# Patient Record
Sex: Female | Born: 1995 | Race: Black or African American | Hispanic: No | Marital: Single | Smoking: Never smoker
Health system: Southern US, Community
[De-identification: ages and names within clinical notes are randomized; demographics above are authoritative.]

## PROBLEM LIST (undated history)

## (undated) ENCOUNTER — Inpatient Hospital Stay (HOSPITAL_COMMUNITY): Payer: Self-pay

## (undated) DIAGNOSIS — K219 Gastro-esophageal reflux disease without esophagitis: Secondary | ICD-10-CM

## (undated) DIAGNOSIS — Z8774 Personal history of (corrected) congenital malformations of heart and circulatory system: Secondary | ICD-10-CM

## (undated) DIAGNOSIS — D649 Anemia, unspecified: Secondary | ICD-10-CM

## (undated) DIAGNOSIS — N730 Acute parametritis and pelvic cellulitis: Secondary | ICD-10-CM

## (undated) HISTORY — DX: Personal history of (corrected) congenital malformations of heart and circulatory system: Z87.74

## (undated) HISTORY — PX: PATENT DUCTUS ARTERIOUS REPAIR: SHX269

## (undated) HISTORY — DX: Gastro-esophageal reflux disease without esophagitis: K21.9

---
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
## 2017-05-01 ENCOUNTER — Inpatient Hospital Stay (HOSPITAL_COMMUNITY)
Admission: AD | Admit: 2017-05-01 | Discharge: 2017-05-01 | Disposition: A | Discharge status: Home / Self Care | Payer: No Typology Code available for payment source | Source: Ambulatory Visit | Attending: Obstetrics & Gynecology | Admitting: Obstetrics & Gynecology

## 2017-05-01 ENCOUNTER — Encounter (HOSPITAL_COMMUNITY): Payer: Self-pay

## 2017-05-01 DIAGNOSIS — N926 Irregular menstruation, unspecified: Secondary | ICD-10-CM | POA: Diagnosis not present

## 2017-05-01 DIAGNOSIS — Z9889 Other specified postprocedural states: Secondary | ICD-10-CM | POA: Insufficient documentation

## 2017-05-01 DIAGNOSIS — R0781 Pleurodynia: Secondary | ICD-10-CM | POA: Diagnosis present

## 2017-05-01 DIAGNOSIS — Z880 Allergy status to penicillin: Secondary | ICD-10-CM | POA: Diagnosis not present

## 2017-05-01 LAB — URINALYSIS, ROUTINE W REFLEX MICROSCOPIC
BACTERIA UA: NONE SEEN
BILIRUBIN URINE: NEGATIVE
Glucose, UA: NEGATIVE mg/dL
Ketones, ur: NEGATIVE mg/dL
LEUKOCYTES UA: NEGATIVE
NITRITE: NEGATIVE
PH: 5 (ref 5.0–8.0)
Protein, ur: NEGATIVE mg/dL
SPECIFIC GRAVITY, URINE: 1.017 (ref 1.005–1.030)

## 2017-05-01 LAB — POCT PREGNANCY, URINE: Preg Test, Ur: NEGATIVE

## 2017-05-01 MED ORDER — NAPROXEN 500 MG PO TABS: 500.0000 mg | ORAL_TABLET | Freq: Two times a day (BID) | ORAL | 0 refills | Status: DC

## 2017-05-01 NOTE — MAU Note (Signed)
Pt presents to MAU with complaints of pain in the upper part of her abdomen since Thursday. Reports her last normal cycle was April the 28th. Scant amount of VB noted earlier in the week

## 2017-05-01 NOTE — Discharge Instructions (Signed)
Costochondritis Costochondritis is swelling and irritation (inflammation) of the tissue (cartilage) that connects your ribs to your breastbone (sternum). This causes pain in the front of your chest. Usually, the pain:  Starts gradually.  Is in more than one rib. This condition usually goes away on its own over time. Follow these instructions at home:  Do not do anything that makes your pain worse.  If directed, put ice on the painful area:  Put ice in a plastic bag.  Place a towel between your skin and the bag.  Leave the ice on for 20 minutes, 2-3 times a day.  If directed, put heat on the affected area as often as told by your doctor. Use the heat source that your doctor tells you to use, such as a moist heat pack or a heating pad.  Place a towel between your skin and the heat source.  Leave the heat on for 20-30 minutes.  Take off the heat if your skin turns bright red. This is very important if you cannot feel pain, heat, or cold. You may have a greater risk of getting burned.  Take over-the-counter and prescription medicines only as told by your doctor.  Return to your normal activities as told by your doctor. Ask your doctor what activities are safe for you.  Keep all follow-up visits as told by your doctor. This is important. Contact a doctor if:  You have chills or a fever.  Your pain does not go away or it gets worse.  You have a cough that does not go away. Get help right away if:  You are short of breath. This information is not intended to replace advice given to you by your health care provider. Make sure you discuss any questions you have with your health care provider. Document Released: 05/10/2008 Document Revised: 06/11/2016 Document Reviewed: 03/17/2016 Elsevier Interactive Patient Education  2017 [employer]  

## 2017-05-01 NOTE — MAU Provider Note (Signed)
None     Chief Complaint:  Possible Pregnancy and Abdominal Pain   Lori Moses is  21 y.o. G0P0000.  Patient's last menstrual period was 03/29/2017.Marland Kitchen  Her pregnancy status is negative.  She actually started her period around 0200, but was concerned because it was 2 days late.  Also, she has had some intermittent bilateral rib pain for 4 days.  Doesn't hurt when breathing deep, moving, or really asso w/anything in particular.  Just has random moments of it hurting on her right and left sides where her ribs are.    Past Medical History:  Diagnosis Date  . Medical history non-contributory     Past Surgical History:  Procedure Laterality Date  . HEART TUMOR EXCISION     as an infant    No family history on file.  Social History  Substance Use Topics  . Smoking status: Never Smoker  . Smokeless tobacco: Never Used  . Alcohol use No    Allergies:  Allergies  Allergen Reactions  . Other Hives    potatoes  . Penicillins Hives    Has patient had a PCN reaction causing immediate rash, facial/tongue/throat swelling, SOB or lightheadedness with hypotension: Yes Has patient had a PCN reaction causing severe rash involving mucus membranes or skin necrosis: No Has patient had a PCN reaction that required hospitalization Yes Has patient had a PCN reaction occurring within the last 10 years: No If all of the above answers are "NO", then may proceed with Cephalosporin use.     Prescriptions Prior to Admission  Medication Sig Dispense Refill Last Dose  . cetirizine (ZYRTEC) 10 MG tablet Take 10 mg by mouth daily.   Past Week at Unknown time  . IRON PO Take 1 tablet by mouth daily.   Past Week at Unknown time  . clindamycin (CLEOCIN) 300 MG capsule Take 1 capsule (300 mg total) by mouth 2 (two) times daily. (Patient not taking: Reported on 05/01/2017) 14 capsule 0 Not Taking at Unknown time     Review of Systems   Constitutional: Negative for fever and chills Eyes: Negative for  visual disturbances Respiratory: Negative for shortness of breath, dyspnea Cardiovascular: Negative for chest pain or palpitations  Gastrointestinal: Negative for vomiting, diarrhea and constipation Genitourinary: Negative for dysuria and urgency Musculoskeletal: Negative for back pain Neurological: Negative for dizziness and headaches     Physical Exam   Blood pressure 138/60, pulse 88, temperature 98 F (36.7 C), resp. rate 18, height 5\' 4"  (1.626 m), weight 64.4 kg (142 lb), last menstrual period 03/29/2017, SpO2 99 %.  General: General appearance - alert, well appearing, and in no distress and oriented to person, place, and time Chest - normal respiratory effort Heart - normal rate and regular rhythm Abdomen - soft, non tender.  Ribs not tender to palpation.  No pain at all during exam Pelvic - menstrual blood.  Cx non fribale.  Extremities - no pedal edema noted   Labs: Results for orders placed or performed during the hospital encounter of 05/01/17 (from the past 24 hour(s))  Urinalysis, Routine w reflex microscopic   Collection Time: 05/01/17  1:05 PM  Result Value Ref Range   Color, Urine YELLOW YELLOW   APPearance CLEAR CLEAR   Specific Gravity, Urine 1.017 1.005 - 1.030   pH 5.0 5.0 - 8.0   Glucose, UA NEGATIVE NEGATIVE mg/dL   Hgb urine dipstick LARGE (A) NEGATIVE   Bilirubin Urine NEGATIVE NEGATIVE   Ketones, ur NEGATIVE NEGATIVE  mg/dL   Protein, ur NEGATIVE NEGATIVE mg/dL   Nitrite NEGATIVE NEGATIVE   Leukocytes, UA NEGATIVE NEGATIVE   RBC / HPF TOO NUMEROUS TO COUNT 0 - 5 RBC/hpf   WBC, UA 0-5 0 - 5 WBC/hpf   Bacteria, UA NONE SEEN NONE SEEN   Squamous Epithelial / LPF 0-5 (A) NONE SEEN   Mucous PRESENT   Pregnancy, urine POC   Collection Time: 05/01/17  1:13 PM  Result Value Ref Range   Preg Test, Ur NEGATIVE NEGATIVE   Imaging Studies:  No results found.   Assessment: Period "late" by 2 days, started this morning.   Plan: Declines BC.   Naproxyn for rib pain.  F/U if rib pain is persistent or becomes more severe or asso w/other sx.   CRESENZO-DISHMAN,Aryanah Enslow

## 2017-05-03 LAB — GC/CHLAMYDIA PROBE AMP (~~LOC~~) NOT AT ARMC
CHLAMYDIA, DNA PROBE: NEGATIVE
Neisseria Gonorrhea: NEGATIVE

## 2017-05-06 DIAGNOSIS — N730 Acute parametritis and pelvic cellulitis: Secondary | ICD-10-CM

## 2017-05-06 HISTORY — DX: Acute parametritis and pelvic cellulitis: N73.0

## 2017-05-17 ENCOUNTER — Encounter (HOSPITAL_COMMUNITY): Payer: Self-pay | Admitting: *Deleted

## 2017-05-17 ENCOUNTER — Inpatient Hospital Stay (HOSPITAL_COMMUNITY): Payer: No Typology Code available for payment source

## 2017-05-17 ENCOUNTER — Inpatient Hospital Stay (HOSPITAL_COMMUNITY)
Admission: AD | Admit: 2017-05-17 | Discharge: 2017-05-17 | Disposition: A | Discharge status: Home / Self Care | Payer: No Typology Code available for payment source | Source: Ambulatory Visit | Attending: Obstetrics & Gynecology | Admitting: Obstetrics & Gynecology

## 2017-05-17 DIAGNOSIS — N730 Acute parametritis and pelvic cellulitis: Secondary | ICD-10-CM

## 2017-05-17 DIAGNOSIS — N83291 Other ovarian cyst, right side: Secondary | ICD-10-CM | POA: Diagnosis not present

## 2017-05-17 DIAGNOSIS — Z880 Allergy status to penicillin: Secondary | ICD-10-CM | POA: Diagnosis not present

## 2017-05-17 DIAGNOSIS — N739 Female pelvic inflammatory disease, unspecified: Secondary | ICD-10-CM | POA: Insufficient documentation

## 2017-05-17 DIAGNOSIS — M549 Dorsalgia, unspecified: Secondary | ICD-10-CM | POA: Diagnosis present

## 2017-05-17 DIAGNOSIS — R102 Pelvic and perineal pain: Secondary | ICD-10-CM | POA: Insufficient documentation

## 2017-05-17 LAB — URINALYSIS, ROUTINE W REFLEX MICROSCOPIC
BILIRUBIN URINE: NEGATIVE
Glucose, UA: NEGATIVE mg/dL
Ketones, ur: NEGATIVE mg/dL
NITRITE: NEGATIVE
PH: 6 (ref 5.0–8.0)
Protein, ur: NEGATIVE mg/dL
SPECIFIC GRAVITY, URINE: 1.025 (ref 1.005–1.030)

## 2017-05-17 LAB — CBC
HCT: 37.2 % (ref 36.0–46.0)
Hemoglobin: 12.5 g/dL (ref 12.0–15.0)
MCH: 27.7 pg (ref 26.0–34.0)
MCHC: 33.6 g/dL (ref 30.0–36.0)
MCV: 82.3 fL (ref 78.0–100.0)
PLATELETS: 186 10*3/uL (ref 150–400)
RBC: 4.52 MIL/uL (ref 3.87–5.11)
RDW: 13.8 % (ref 11.5–15.5)
WBC: 5.5 10*3/uL (ref 4.0–10.5)

## 2017-05-17 LAB — WET PREP, GENITAL
CLUE CELLS WET PREP: NONE SEEN
SPERM: NONE SEEN
Trich, Wet Prep: NONE SEEN
Yeast Wet Prep HPF POC: NONE SEEN

## 2017-05-17 LAB — POCT PREGNANCY, URINE: PREG TEST UR: NEGATIVE

## 2017-05-17 MED ORDER — DOXYCYCLINE HYCLATE 100 MG PO CAPS: 100.0000 mg | ORAL_CAPSULE | Freq: Two times a day (BID) | ORAL | 0 refills | Status: DC

## 2017-05-17 MED ORDER — CEFTRIAXONE SODIUM 250 MG IJ SOLR
250.0000 mg | INTRAMUSCULAR | Status: DC
  Administered 2017-05-17: 250 mg via INTRAMUSCULAR
  Filled 2017-05-17: qty 250

## 2017-05-17 MED ORDER — AZITHROMYCIN 250 MG PO TABS
1000.0000 mg | ORAL_TABLET | Freq: Once | ORAL | Status: AC
  Administered 2017-05-17: 1000 mg via ORAL
  Filled 2017-05-17: qty 4

## 2017-05-17 NOTE — MAU Note (Addendum)
At 2105 Ceftriaxone 250mg /ml, given IM, right dorsal gluteal muscle, mixed with Lidocaine, pt. Tolerated procedure with small amount of discomfort.   No reaction noted, pt. Currently texting on cell. phone, per pt. No itching, no SOB, no swelling, no signs of an anaphylactic reaction. RN will continue to observe.

## 2017-05-17 NOTE — Discharge Instructions (Signed)
Pelvic Inflammatory Disease °Pelvic inflammatory disease (PID) refers to an infection in some or all of the female organs. The infection can be in the uterus, ovaries, fallopian tubes, or the surrounding tissues in the pelvis. PID can cause abdominal or pelvic pain that comes on suddenly (acute pelvic pain). PID is a serious infection because it can lead to lasting (chronic) pelvic pain or the inability to have children (infertility). °What are the causes? °This condition is most often caused by an infection that is spread during sexual contact. However, the infection can also be caused by the normal bacteria that are found in the vaginal tissues if these bacteria travel upward into the reproductive organs. PID can also occur following: °· The birth of a baby. °· A miscarriage. °· An abortion. °· Major pelvic surgery. °· The use of an intrauterine device (IUD). °· A sexual assault. ° °What increases the risk? °This condition is more likely to develop in women who: °· Are younger than 19 years of age. °· Are sexually active at a [place] age. °· Use nonbarrier contraception. °· Have multiple sexual partners. °· Have sex with someone who has symptoms of an STD (sexually transmitted disease). °· Use oral contraception. ° °At times, certain behaviors can also increase the possibility of getting PID, such as: °· Using a vaginal douche. °· Having an IUD in place. ° °What are the signs or symptoms? °Symptoms of this condition include: °· Abdominal or pelvic pain. °· Fever. °· Chills. °· Abnormal vaginal discharge. °· Abnormal uterine bleeding. °· Unusual pain shortly after the end of a menstrual period. °· Painful urination. °· Pain with sexual intercourse. °· Nausea and vomiting. ° °How is this diagnosed? °To diagnose this condition, your health care provider will do a physical exam and take your medical history. A pelvic exam typically reveals great tenderness in the uterus and the surrounding pelvic tissues. You may also  have tests, such as: °· Lab tests, including a pregnancy test, blood tests, and urine test. °· Culture tests of the vagina and cervix to check for an STD. °· Ultrasound. °· A laparoscopic procedure to look inside the pelvis. °· Examining vaginal secretions under a microscope. ° °How is this treated? °Treatment for this condition may involve one or more approaches. °· Antibiotic medicines may be prescribed to be taken by mouth. °· Sexual partners may need to be treated if the infection is caused by an STD. °· For more severe cases, hospitalization may be needed to give antibiotics directly into a vein through an IV tube. °· Surgery may be needed if other treatments do not help, but this is rare. ° °It may take weeks until you are completely well. If you are diagnosed with PID, you should also be checked for human immunodeficiency virus (HIV). Your health care provider may test you for infection again 3 months after treatment. You should not have unprotected sex. °Follow these instructions at home: °· Take over-the-counter and prescription medicines only as told by your health care provider. °· If you were prescribed an antibiotic medicine, take it as told by your health care provider. Do not stop taking the antibiotic even if you start to feel better. °· Do not have sexual intercourse until treatment is completed or as told by your health care provider. If PID is confirmed, your recent sexual partners will need treatment, especially if you had unprotected sex. °· Keep all follow-up visits as told by your health care provider. This is important. °Contact a health care   provider if: °· You have increased or abnormal vaginal discharge. °· Your pain does not improve. °· You vomit. °· You have a fever. °· You cannot tolerate your medicines. °· Your partner has an STD. °· You have pain when you urinate. °Get help right away if: °· You have increased abdominal or pelvic pain. °· You have chills. °· Your symptoms are not  better in 72 hours even with treatment. °This information is not intended to replace advice given to you by your health care provider. Make sure you discuss any questions you have with your health care provider. °Document Released: 11/22/2005 Document Revised: 04/29/2016 Document Reviewed: 12/30/2014 °Elsevier Interactive Patient Education © 2018 [employer] ° °

## 2017-05-17 NOTE — MAU Note (Signed)
Pt. Assessment of Ceftriaxone medication given at 9:03; no reaction noted.  Pt. denies hives, difficulty breathing, or itching.  Stable.

## 2017-05-17 NOTE — MAU Provider Note (Signed)
History     CSN: [id number]  Arrival date and time: 05/17/17 1605   First Provider Initiated Contact with Patient 05/17/17 1717     HPI Lori Moses is a 21 y.o. G0P0000 non pregnant female who presents complaining of left side pain that goes all the way down her leg. She states it started a week ago and she cannot lie on her side. She states the pain is constant and she rates it a 8/10 and has not tried anything for the pain. She also reports lower abdominal and back pain. She is sexually active and sometimes uses condoms. She reports a white discharge and pain with wiping her vagina.   OB History    Gravida Para Term Preterm AB Living   0 0 0 0 0 0   SAB TAB Ectopic Multiple Live Births   0 0 0 0        Past Medical History:  Diagnosis Date  . Medical history non-contributory     Past Surgical History:  Procedure Laterality Date  . HEART TUMOR EXCISION     as an infant    History reviewed. No pertinent family history.  Social History  Substance Use Topics  . Smoking status: Never Smoker  . Smokeless tobacco: Never Used  . Alcohol use No    Allergies:  Allergies  Allergen Reactions  . Other Hives    potatoes  . Penicillins Hives    Has patient had a PCN reaction causing immediate rash, facial/tongue/throat swelling, SOB or lightheadedness with hypotension: Yes Has patient had a PCN reaction causing severe rash involving mucus membranes or skin necrosis: No Has patient had a PCN reaction that required hospitalization No Has patient had a PCN reaction occurring within the last 10 years: No If all of the above answers are "NO", then may proceed with Cephalosporin use.     Prescriptions Prior to Admission  Medication Sig Dispense Refill Last Dose  . cetirizine (ZYRTEC) 10 MG tablet Take 10 mg by mouth daily.   Past Week at Unknown time  . IRON PO Take 1 tablet by mouth daily.   Past Week at Unknown time  . naproxen (NAPROSYN) 500 MG tablet Take 1 tablet (500  mg total) by mouth 2 (two) times daily with a meal. 30 tablet 0     Review of Systems  Constitutional: Negative.  Negative for activity change, appetite change, fatigue and fever.  Respiratory: Negative.   Cardiovascular: Negative.  Negative for chest pain.  Gastrointestinal: Positive for abdominal pain. Negative for constipation, diarrhea, nausea and vomiting.  Genitourinary: Positive for vaginal discharge. Negative for dysuria and vaginal bleeding.  Neurological: Negative.  Negative for dizziness and headaches.   Physical Exam   Blood pressure 116/65, pulse 68, temperature 98.5 F (36.9 C), temperature source Oral, resp. rate 17, height 5\' 6"  (1.676 m), weight 141 lb 1.9 oz (64 kg), SpO2 99 %.  Physical Exam  Nursing note and vitals reviewed. Constitutional: She is oriented to person, place, and time. She appears well-developed and well-nourished.  HENT:  Head: Normocephalic and atraumatic.  Eyes: Conjunctivae are normal. No scleral icterus.  Cardiovascular: Normal rate, regular rhythm and normal heart sounds.   Respiratory: Effort normal and breath sounds normal. No respiratory distress.  GI: Soft. She exhibits no distension. There is no tenderness. There is no guarding.  Genitourinary: Uterus normal. Cervix exhibits motion tenderness, discharge and friability. There is tenderness in the vagina. Vaginal discharge found.  Genitourinary Comments: Copious amount  of mucopurulent discharge noted at cervical os  Neurological: She is alert and oriented to person, place, and time.  Skin: Skin is warm and dry.  Psychiatric: She has a normal mood and affect. Her behavior is normal. Judgment and thought content normal.   MAU Course  Procedures Results for orders placed or performed during the hospital encounter of 05/17/17 (from the past 24 hour(s))  Urinalysis, Routine w reflex microscopic     Status: Abnormal   Collection Time: 05/17/17  4:27 PM  Result Value Ref Range   Color, Urine  YELLOW YELLOW   APPearance CLEAR CLEAR   Specific Gravity, Urine 1.025 1.005 - 1.030   pH 6.0 5.0 - 8.0   Glucose, UA NEGATIVE NEGATIVE mg/dL   Hgb urine dipstick SMALL (A) NEGATIVE   Bilirubin Urine NEGATIVE NEGATIVE   Ketones, ur NEGATIVE NEGATIVE mg/dL   Protein, ur NEGATIVE NEGATIVE mg/dL   Nitrite NEGATIVE NEGATIVE   Leukocytes, UA LARGE (A) NEGATIVE   RBC / HPF 0-5 0 - 5 RBC/hpf   WBC, UA 6-30 0 - 5 WBC/hpf   Bacteria, UA RARE (A) NONE SEEN   Squamous Epithelial / LPF 0-5 (A) NONE SEEN   Mucous PRESENT   Pregnancy, urine POC     Status: None   Collection Time: 05/17/17  4:45 PM  Result Value Ref Range   Preg Test, Ur NEGATIVE NEGATIVE  CBC     Status: None   Collection Time: 05/17/17  5:26 PM  Result Value Ref Range   WBC 5.5 4.0 - 10.5 K/uL   RBC 4.52 3.87 - 5.11 MIL/uL   Hemoglobin 12.5 12.0 - 15.0 g/dL   HCT 16.1 09.6 - 04.5 %   MCV 82.3 78.0 - 100.0 fL   MCH 27.7 26.0 - 34.0 pg   MCHC 33.6 30.0 - 36.0 g/dL   RDW 40.9 81.1 - 91.4 %   Platelets 186 150 - 400 K/uL  Wet prep, genital     Status: Abnormal   Collection Time: 05/17/17  5:36 PM  Result Value Ref Range   Yeast Wet Prep HPF POC NONE SEEN NONE SEEN   Trich, Wet Prep NONE SEEN NONE SEEN   Clue Cells Wet Prep HPF POC NONE SEEN NONE SEEN   WBC, Wet Prep HPF POC MANY (A) NONE SEEN   Sperm NONE SEEN    US Transvaginal Non-ob  Result Date: 05/17/2017 CLINICAL DATA:  None lower quadrant and back pain with cramping x2 weeks. EXAM: TRANSABDOMINAL AND TRANSVAGINAL ULTRASOUND OF PELVIS TECHNIQUE: Both transabdominal and transvaginal ultrasound examinations of the pelvis were performed. Transabdominal technique was performed for global imaging of the pelvis including uterus, ovaries, adnexal regions, and pelvic cul-de-sac. It was necessary to proceed with endovaginal exam following the transabdominal exam to visualize the uterus, endometrium and both ovaries. COMPARISON:  None FINDINGS: Uterus Measurements: 8.3 x 4 x  4.6 cm. No fibroids or other mass visualized. Endometrium Thickness: 8.8 mm.  No focal abnormality visualized. Right ovary Measurements: 3.7 x 2.6 x 3.8 cm. There appears to be an involuting cyst or follicle associated with the right ovary measuring 2.2 x 1.7 x 1.8 cm with internal debris. No mural nodularity or calcifications. Left ovary Measurements: 3 x 2.2 x 1.8 cm. Normal appearance/no adnexal mass. Other findings No abnormal free fluid. IMPRESSION: Involuting follicle or cyst in the right ovary measuring 2.2 x 1.7 x 1.8 cm with slightly crenulated margins and internal debris noted. Otherwise negative exam. Electronically Signed   By:  Tollie Eth M.D.   On: 05/17/2017 20:01   US Pelvis Complete  Result Date: 05/17/2017 CLINICAL DATA:  None lower quadrant and back pain with cramping x2 weeks. EXAM: TRANSABDOMINAL AND TRANSVAGINAL ULTRASOUND OF PELVIS TECHNIQUE: Both transabdominal and transvaginal ultrasound examinations of the pelvis were performed. Transabdominal technique was performed for global imaging of the pelvis including uterus, ovaries, adnexal regions, and pelvic cul-de-sac. It was necessary to proceed with endovaginal exam following the transabdominal exam to visualize the uterus, endometrium and both ovaries. COMPARISON:  None FINDINGS: Uterus Measurements: 8.3 x 4 x 4.6 cm. No fibroids or other mass visualized. Endometrium Thickness: 8.8 mm.  No focal abnormality visualized. Right ovary Measurements: 3.7 x 2.6 x 3.8 cm. There appears to be an involuting cyst or follicle associated with the right ovary measuring 2.2 x 1.7 x 1.8 cm with internal debris. No mural nodularity or calcifications. Left ovary Measurements: 3 x 2.2 x 1.8 cm. Normal appearance/no adnexal mass. Other findings No abnormal free fluid. IMPRESSION: Involuting follicle or cyst in the right ovary measuring 2.2 x 1.7 x 1.8 cm with slightly crenulated margins and internal debris noted. Otherwise negative exam. Electronically  Signed   By: Tollie Eth M.D.   On: 05/17/2017 20:01    MDM UA, UPT, CBC, wet prep and gc/chlamydia, ultrasound Offered pain medication, patient declined.  Patient discomfort with exam, highly concerning for PID.  Azithromycin 1g and ceftriaxone 250mg  IM given in MAU, per CDC guidelines. Discussed risks and benefits with patient's hx of penicillin allergy and patient agreeable to take medication.   Assessment and Plan   1. PID (acute pelvic inflammatory disease)   2. Pelvic pain   3. Pelvic pain in female    Discharge patient home in stable condition.  Doxycycline 100mg  PO BID for 14 days.  Return precautions reviewed with patient, return to MAU as needed.  Encouraged patient to use condoms every time she has intercourse and establish care with gyn provider.   Cleone Slim SNM 05/17/2017, 8:31 PM    I confirm that I have verified the information documented in the midwife student's note and that I have also personally reperformed the physical exam and all medical decision making activities.

## 2017-05-17 NOTE — MAU Note (Addendum)
+  Lower right abdominal pain/swelling +lower back back pain Cramping +left leg pain; intermittent Pain started last week of may 5/27 Rating pain 8/10 Worse with ambulation Has not tried anything for the pain  Neg HPT LMP 05/01/17 --patient states was unusual period for her very light and only lasted 3 days Brown spotting now

## 2017-05-18 LAB — GC/CHLAMYDIA PROBE AMP (~~LOC~~) NOT AT ARMC
CHLAMYDIA, DNA PROBE: NEGATIVE
Neisseria Gonorrhea: NEGATIVE

## 2017-06-16 ENCOUNTER — Inpatient Hospital Stay (HOSPITAL_COMMUNITY)
Admission: AD | Admit: 2017-06-16 | Discharge: 2017-06-16 | Discharge status: Against Medical Advice | Payer: No Typology Code available for payment source | Source: Ambulatory Visit | Attending: Obstetrics & Gynecology | Admitting: Obstetrics & Gynecology

## 2017-06-16 NOTE — MAU Note (Signed)
Pt not in lobby x2 

## 2017-06-16 NOTE — MAU Note (Signed)
Not in lobby x3 assume pt left will discharge AMA

## 2017-06-16 NOTE — MAU Note (Signed)
Not in lobby x1  

## 2017-06-19 ENCOUNTER — Emergency Department (HOSPITAL_COMMUNITY)
Admission: EM | Admit: 2017-06-19 | Discharge: 2017-06-19 | Disposition: A | Discharge status: Home / Self Care | Payer: No Typology Code available for payment source | Attending: Emergency Medicine | Admitting: Emergency Medicine

## 2017-06-19 ENCOUNTER — Encounter (HOSPITAL_COMMUNITY): Payer: Self-pay

## 2017-06-19 DIAGNOSIS — M545 Low back pain, unspecified: Secondary | ICD-10-CM

## 2017-06-19 HISTORY — DX: Acute parametritis and pelvic cellulitis: N73.0

## 2017-06-19 LAB — CBC
HEMATOCRIT: 35.9 % — AB (ref 36.0–46.0)
HEMOGLOBIN: 11.7 g/dL — AB (ref 12.0–15.0)
MCH: 26.9 pg (ref 26.0–34.0)
MCHC: 32.6 g/dL (ref 30.0–36.0)
MCV: 82.5 fL (ref 78.0–100.0)
Platelets: 179 10*3/uL (ref 150–400)
RBC: 4.35 MIL/uL (ref 3.87–5.11)
RDW: 13.5 % (ref 11.5–15.5)
WBC: 5.3 10*3/uL (ref 4.0–10.5)

## 2017-06-19 LAB — COMPREHENSIVE METABOLIC PANEL
ALBUMIN: 3.8 g/dL (ref 3.5–5.0)
ALT: 12 U/L — ABNORMAL LOW (ref 14–54)
AST: 20 U/L (ref 15–41)
Alkaline Phosphatase: 55 U/L (ref 38–126)
Anion gap: 5 (ref 5–15)
BILIRUBIN TOTAL: 1 mg/dL (ref 0.3–1.2)
BUN: 9 mg/dL (ref 6–20)
CHLORIDE: 106 mmol/L (ref 101–111)
CO2: 27 mmol/L (ref 22–32)
Calcium: 8.9 mg/dL (ref 8.9–10.3)
Creatinine, Ser: 0.85 mg/dL (ref 0.44–1.00)
GFR calc Af Amer: >60 mL/min (ref >60)
GFR calc non Af Amer: >60 mL/min (ref >60)
GLUCOSE: 82 mg/dL (ref 65–99)
POTASSIUM: 3.8 mmol/L (ref 3.5–5.1)
SODIUM: 138 mmol/L (ref 135–145)
TOTAL PROTEIN: 6.7 g/dL (ref 6.5–8.1)

## 2017-06-19 LAB — URINALYSIS, ROUTINE W REFLEX MICROSCOPIC
BACTERIA UA: NONE SEEN
BILIRUBIN URINE: NEGATIVE
Glucose, UA: NEGATIVE mg/dL
HGB URINE DIPSTICK: NEGATIVE
Ketones, ur: NEGATIVE mg/dL
NITRITE: NEGATIVE
PROTEIN: NEGATIVE mg/dL
Specific Gravity, Urine: 1.023 (ref 1.005–1.030)
pH: 7 (ref 5.0–8.0)

## 2017-06-19 LAB — I-STAT BETA HCG BLOOD, ED (MC, WL, AP ONLY)

## 2017-06-19 LAB — LIPASE, BLOOD: LIPASE: 24 U/L (ref 11–51)

## 2017-06-19 MED ORDER — CEPHALEXIN 250 MG PO CAPS
500.0000 mg | ORAL_CAPSULE | Freq: Once | ORAL | Status: AC
  Administered 2017-06-19: 500 mg via ORAL
  Filled 2017-06-19: qty 2

## 2017-06-19 MED ORDER — IBUPROFEN 400 MG PO TABS
400.0000 mg | ORAL_TABLET | Freq: Once | ORAL | Status: AC
  Administered 2017-06-19: 400 mg via ORAL
  Filled 2017-06-19: qty 1

## 2017-06-19 MED ORDER — CEPHALEXIN 500 MG PO CAPS: 500.0000 mg | ORAL_CAPSULE | Freq: Three times a day (TID) | ORAL | 0 refills | Status: DC

## 2017-06-19 MED ORDER — ACETAMINOPHEN 325 MG PO TABS
650.0000 mg | ORAL_TABLET | Freq: Once | ORAL | Status: AC
  Administered 2017-06-19: 650 mg via ORAL
  Filled 2017-06-19: qty 2

## 2017-06-19 NOTE — Discharge Instructions (Signed)
It was our pleasure to provide your ER care today - we hope that you feel better.  Your blood work looks good/normal.   Your urine tests show a possible urine infection - take antibiotic as prescribed.  Take ibuprofen and/or acetaminophen as need for pain.   Follow up with primary care doctor in 1 week if symptoms fail to improve/resolve.  Return to ER if worse, new symptoms, high fevers, new or severe abdominal or pelvic pain, other concern.

## 2017-06-19 NOTE — ED Provider Notes (Signed)
[facility]-EMERGENCY DEPT Provider Note   CSN: [id number] Arrival date & time: 06/19/17  1719     History   Chief Complaint Chief Complaint  Patient presents with  . Back Pain    HPI Lori Moses is a 21 y.o. female.  Patient c/o bilateral low back pain in the past couple weeks. Pain constant, dull, moderate. No specific exacerbating or alleviating factors. No radicular pain. No numbness/weakness. Denies injury, fall or strain. No anterior pain, no abd or pelvic pain. lnmp 3 weeks ago. No unusual vaginal discharge or bleeding. No fever or chills. No dysuria.    The history is provided by the patient.  Abdominal Pain   Pertinent negatives include fever, diarrhea, vomiting, constipation, dysuria and headaches.    Past Medical History:  Diagnosis Date  . Medical history non-contributory   . PID (acute pelvic inflammatory disease) 05/2017    There are no active problems to display for this patient.   Past Surgical History:  Procedure Laterality Date  . HEART TUMOR EXCISION     as an infant    OB History    Gravida Para Term Preterm AB Living   0 0 0 0 0 0   SAB TAB Ectopic Multiple Live Births   0 0 0 0         Home Medications    Prior to Admission medications   Medication Sig Start Date End Date Taking? Authorizing Provider  cetirizine (ZYRTEC) 10 MG tablet Take 10 mg by mouth daily.    [provider]  doxycycline (VIBRAMYCIN) 100 MG capsule Take 1 capsule (100 mg total) by mouth 2 (two) times daily. 05/17/17   Aviva Signs, CNM  IRON PO Take 1 tablet by mouth daily.    [provider]  naproxen (NAPROSYN) 500 MG tablet Take 1 tablet (500 mg total) by mouth 2 (two) times daily with a meal. 05/01/17   Cresenzo-Dishmon, Scarlette Calico, CNM    Family History History reviewed. No pertinent family history.  Social History Social History  Substance Use Topics  . Smoking status: Never Smoker  . Smokeless tobacco: Never Used  . Alcohol use No      Allergies   Other and Penicillins   Review of Systems Review of Systems  Constitutional: Negative for chills and fever.  HENT: Negative for sore throat.   Eyes: Negative for redness.  Respiratory: Negative for shortness of breath.   Cardiovascular: Negative for chest pain.  Gastrointestinal: Negative for abdominal pain, constipation, diarrhea and vomiting.  Endocrine: Negative for polyuria.  Genitourinary: Negative for dysuria, flank pain, vaginal bleeding and vaginal discharge.  Musculoskeletal: Positive for back pain.  Skin: Negative for rash.  Neurological: Negative for headaches.  Hematological: Does not bruise/bleed easily.  Psychiatric/Behavioral: Negative for confusion.     Physical Exam Updated Vital Signs BP 108/69   Pulse 92   Temp 98.2 F (36.8 C) (Oral)   Resp 16   LMP 06/01/2017   SpO2 100%   Physical Exam  Constitutional: She appears well-developed and well-nourished. No distress.  HENT:  Head: Atraumatic.  Eyes: Conjunctivae are normal. No scleral icterus.  Neck: Neck supple. No tracheal deviation present.  Cardiovascular: Normal rate, regular rhythm, normal heart sounds and intact distal pulses.   Pulmonary/Chest: Effort normal and breath sounds normal. No respiratory distress.  Abdominal: Soft. Normal appearance and bowel sounds are normal. She exhibits no distension and no mass. There is no tenderness. There is no rebound and no guarding. No hernia.  Genitourinary:  Genitourinary Comments: No cva tenderness  Musculoskeletal: She exhibits no edema.  TLS spine non tender.   Neurological: She is alert.  Steady gait.   Skin: Skin is warm and dry. No rash noted. She is not diaphoretic.  Psychiatric: She has a normal mood and affect.  Nursing note and vitals reviewed.    ED Treatments / Results  Labs (all labs ordered are listed, but only abnormal results are displayed) Results for orders placed or performed during the hospital encounter of  06/19/17  Lipase, blood  Result Value Ref Range   Lipase 24 11 - 51 U/L  Comprehensive metabolic panel  Result Value Ref Range   Sodium 138 135 - 145 mmol/L   Potassium 3.8 3.5 - 5.1 mmol/L   Chloride 106 101 - 111 mmol/L   CO2 27 22 - 32 mmol/L   Glucose, Bld 82 65 - 99 mg/dL   BUN 9 6 - 20 mg/dL   Creatinine, Ser 1.610.85 0.44 - 1.00 mg/dL   Calcium 8.9 8.9 - 09.610.3 mg/dL   Total Protein 6.7 6.5 - 8.1 g/dL   Albumin 3.8 3.5 - 5.0 g/dL   AST 20 15 - 41 U/L   ALT 12 (L) 14 - 54 U/L   Alkaline Phosphatase 55 38 - 126 U/L   Total Bilirubin 1.0 0.3 - 1.2 mg/dL   GFR calc non Af Amer >60 >60 mL/min   GFR calc Af Amer >60 >60 mL/min   Anion gap 5 5 - 15  CBC  Result Value Ref Range   WBC 5.3 4.0 - 10.5 K/uL   RBC 4.35 3.87 - 5.11 MIL/uL   Hemoglobin 11.7 (L) 12.0 - 15.0 g/dL   HCT 04.535.9 (L) 40.936.0 - 81.146.0 %   MCV 82.5 78.0 - 100.0 fL   MCH 26.9 26.0 - 34.0 pg   MCHC 32.6 30.0 - 36.0 g/dL   RDW 91.413.5 78.211.5 - 95.615.5 %   Platelets 179 150 - 400 K/uL  Urinalysis, Routine w reflex microscopic  Result Value Ref Range   Color, Urine YELLOW YELLOW   APPearance HAZY (A) CLEAR   Specific Gravity, Urine 1.023 1.005 - 1.030   pH 7.0 5.0 - 8.0   Glucose, UA NEGATIVE NEGATIVE mg/dL   Hgb urine dipstick NEGATIVE NEGATIVE   Bilirubin Urine NEGATIVE NEGATIVE   Ketones, ur NEGATIVE NEGATIVE mg/dL   Protein, ur NEGATIVE NEGATIVE mg/dL   Nitrite NEGATIVE NEGATIVE   Leukocytes, UA MODERATE (A) NEGATIVE   RBC / HPF 0-5 0 - 5 RBC/hpf   WBC, UA 6-30 0 - 5 WBC/hpf   Bacteria, UA NONE SEEN NONE SEEN   Squamous Epithelial / LPF 6-30 (A) NONE SEEN   Mucous PRESENT   I-Stat beta hCG blood, ED  Result Value Ref Range   I-stat hCG, quantitative <5.0 <5 mIU/mL   Comment 3            EKG  EKG Interpretation None       Radiology No results found.  Procedures Procedures (including critical care time)  Medications Ordered in ED Medications  ibuprofen (ADVIL,MOTRIN) tablet 400 mg (not administered)    acetaminophen (TYLENOL) tablet 650 mg (not administered)     Initial Impression / Assessment and Plan / ED Course  I have reviewed the triage vital signs and the nursing notes.  Pertinent labs & imaging results that were available during my care of the patient were reviewed by me and considered in my medical decision making (see  chart for details).  Labs sent from triage.  Pt has no abd/pelvis pain or tenderness on exam.   Pain is located in low back area ?musculoskeletal.   Reviewed nursing notes and prior charts for additional history.   Pt has taken no meds today for pain. Motrin po.  Blood work unremarkable/normal, ua w mod LE and 6-30 wbc, will tx for possible uti.   abd soft nt, no abd or pelvic pain, afeb.   Pt currently appears stable for d/c.     Final Clinical Impressions(s) / ED Diagnoses   Final diagnoses:  None    New Prescriptions New Prescriptions   No medications on file     Cathren Laine, MD 06/20/17 [phone]

## 2017-06-19 NOTE — ED Triage Notes (Signed)
Onset 4 days ago cramping to LLQ, pain in back, unable to sleep on either side body.  Vag d/c yellow-green.  No fevers.  Pt was treated for PID 3 weeks ago.

## 2017-07-06 ENCOUNTER — Encounter (HOSPITAL_COMMUNITY): Payer: Self-pay | Admitting: *Deleted

## 2017-07-06 ENCOUNTER — Inpatient Hospital Stay (HOSPITAL_COMMUNITY)
Admission: AD | Admit: 2017-07-06 | Discharge: 2017-07-06 | Disposition: A | Discharge status: Home / Self Care | Payer: Self-pay | Source: Ambulatory Visit | Attending: Obstetrics & Gynecology | Admitting: Obstetrics & Gynecology

## 2017-07-06 DIAGNOSIS — Z3202 Encounter for pregnancy test, result negative: Secondary | ICD-10-CM | POA: Insufficient documentation

## 2017-07-06 DIAGNOSIS — B9689 Other specified bacterial agents as the cause of diseases classified elsewhere: Secondary | ICD-10-CM | POA: Insufficient documentation

## 2017-07-06 DIAGNOSIS — Z880 Allergy status to penicillin: Secondary | ICD-10-CM | POA: Insufficient documentation

## 2017-07-06 DIAGNOSIS — N760 Acute vaginitis: Secondary | ICD-10-CM | POA: Insufficient documentation

## 2017-07-06 LAB — URINALYSIS, ROUTINE W REFLEX MICROSCOPIC
Bilirubin Urine: NEGATIVE
GLUCOSE, UA: NEGATIVE mg/dL
Hgb urine dipstick: NEGATIVE
Ketones, ur: NEGATIVE mg/dL
LEUKOCYTES UA: NEGATIVE
NITRITE: NEGATIVE
PROTEIN: NEGATIVE mg/dL
Specific Gravity, Urine: 1.024 (ref 1.005–1.030)
pH: 7 (ref 5.0–8.0)

## 2017-07-06 LAB — POCT PREGNANCY, URINE: Preg Test, Ur: NEGATIVE

## 2017-07-06 LAB — WET PREP, GENITAL
CLUE CELLS WET PREP: NONE SEEN
Sperm: NONE SEEN
TRICH WET PREP: NONE SEEN
YEAST WET PREP: NONE SEEN

## 2017-07-06 MED ORDER — METRONIDAZOLE 500 MG PO TABS: 500.0000 mg | ORAL_TABLET | Freq: Two times a day (BID) | ORAL | 0 refills | Status: DC

## 2017-07-06 MED ORDER — METRONIDAZOLE 0.75 % VA GEL: VAGINAL | 0 refills | Status: DC

## 2017-07-06 NOTE — Discharge Instructions (Signed)

## 2017-07-06 NOTE — MAU Provider Note (Signed)
History     CSN: [id number]  Arrival date and time: 07/06/17 1752   First Provider Initiated Contact with Patient 07/06/17 1839      Chief Complaint  Patient presents with  . Back Pain  . Pelvic Pain   HPI  [name] is a 21 y.o. [id number] who presents to MAU today with complaint of pelvic pain and back pain. The patient states that pain has been present x 1 month. She was seen at [facility] 2 weeks ago and treated for UTI, but feels pain has been worse since then. She states pain is bilateral lower abdomen and upper thighs and low back. She has not taken anything for pain. She rates pain at 9/10 but appears comfortable. She has had a gray discharge and occasional nausea. She denies vomiting, diarrhea, constipation, vaginal bleeding, fever or UTI symptoms.    OB History    Gravida Para Term Preterm AB Living   0 0 0 0 0 0   SAB TAB Ectopic Multiple Live Births   0 0 0 0        Past Medical History:  Diagnosis Date  . Medical history non-contributory   . PID (acute pelvic inflammatory disease) 05/2017    Past Surgical History:  Procedure Laterality Date  . HEART TUMOR EXCISION     as an infant    History reviewed. No pertinent family history.  Social History  Substance Use Topics  . Smoking status: Never Smoker  . Smokeless tobacco: Never Used  . Alcohol use No    Allergies:  Allergies  Allergen Reactions  . Other Hives    potatoes  . Penicillins Hives    Has patient had a PCN reaction causing immediate rash, facial/tongue/throat swelling, SOB or lightheadedness with hypotension: Yes Has patient had a PCN reaction causing severe rash involving mucus membranes or skin necrosis: No Has patient had a PCN reaction that required hospitalization No Has patient had a PCN reaction occurring within the last 10 years: No If all of the above answers are "NO", then may proceed with Cephalosporin use.     Prescriptions Prior to Admission  Medication Sig Dispense  Refill Last Dose  . cephALEXin (KEFLEX) 500 MG capsule Take 1 capsule (500 mg total) by mouth 3 (three) times daily. 15 capsule 0   . cetirizine (ZYRTEC) 10 MG tablet Take 10 mg by mouth daily.   Past Week at Unknown time  . doxycycline (VIBRAMYCIN) 100 MG capsule Take 1 capsule (100 mg total) by mouth 2 (two) times daily. 14 capsule 0   . IRON PO Take 1 tablet by mouth daily.   Past Week at Unknown time  . naproxen (NAPROSYN) 500 MG tablet Take 1 tablet (500 mg total) by mouth 2 (two) times daily with a meal. 30 tablet 0     Review of Systems  Constitutional: Negative for fever.  Gastrointestinal: Positive for abdominal pain and nausea. Negative for constipation, diarrhea and vomiting.  Genitourinary: Positive for pelvic pain and vaginal discharge. Negative for dysuria, frequency, urgency and vaginal bleeding.   Physical Exam   Blood pressure 114/61, pulse 84, temperature 97.7 F (36.5 C), temperature source Oral, resp. rate 16, weight 142 lb 12 oz (64.8 kg), last menstrual period 06/30/2017, SpO2 100 %.  Physical Exam  Nursing note and vitals reviewed. Constitutional: She is oriented to person, place, and time. She appears well-developed and well-nourished. No distress.  HENT:  Head: Normocephalic and atraumatic.  Cardiovascular: Normal rate.  Respiratory: Effort normal.  GI: Soft. She exhibits no distension and no mass. There is no tenderness. There is no rebound and no guarding.  Genitourinary: Uterus is not enlarged and not tender. Cervix exhibits friability. Cervix exhibits no motion tenderness and no discharge. Right adnexum displays no mass and no tenderness. Left adnexum displays no mass and no tenderness. No bleeding in the vagina. Vaginal discharge (scant) found.  Neurological: She is alert and oriented to person, place, and time.  Skin: Skin is warm and dry. No erythema.  Psychiatric: She has a normal mood and affect.   Results for orders placed or performed during the  hospital encounter of 07/06/17 (from the past 24 hour(s))  Urinalysis, Routine w reflex microscopic     Status: None   Collection Time: 07/06/17  6:20 PM  Result Value Ref Range   Color, Urine YELLOW YELLOW   APPearance CLEAR CLEAR   Specific Gravity, Urine 1.024 1.005 - 1.030   pH 7.0 5.0 - 8.0   Glucose, UA NEGATIVE NEGATIVE mg/dL   Hgb urine dipstick NEGATIVE NEGATIVE   Bilirubin Urine NEGATIVE NEGATIVE   Ketones, ur NEGATIVE NEGATIVE mg/dL   Protein, ur NEGATIVE NEGATIVE mg/dL   Nitrite NEGATIVE NEGATIVE   Leukocytes, UA NEGATIVE NEGATIVE  Pregnancy, urine POC     Status: None   Collection Time: 07/06/17  6:43 PM  Result Value Ref Range   Preg Test, Ur NEGATIVE NEGATIVE  Wet prep, genital     Status: Abnormal   Collection Time: 07/06/17  6:57 PM  Result Value Ref Range   Yeast Wet Prep HPF POC NONE SEEN NONE SEEN   Trich, Wet Prep NONE SEEN NONE SEEN   Clue Cells Wet Prep HPF POC NONE SEEN NONE SEEN   WBC, Wet Prep HPF POC MANY (A) NONE SEEN   Sperm NONE SEEN     MAU Course  Procedures None  MDM UPT - negative UA, wet prep today   Assessment and Plan  A: Bacterial vaginosis, recurrent   P: Discharge home Rx for Flagyl and Metrogel given  Discussed hygiene products and probiotics for avoiding recurrence Patient advised to follow-up with [facility] as needed  Patient may return to MAU as needed or if her condition were to change or worsen   Vonzella NippleJulie Wenzel, PA-C 07/06/2017, 7:29 PM

## 2017-07-06 NOTE — MAU Note (Signed)
Having back & side pain, pelvic and hip pain.  Was treated for a UTI a couple wks ago.  Thinks it is either the pelvic or bacterial infection is back.  Pain started about a month ago.

## 2017-07-25 ENCOUNTER — Emergency Department (HOSPITAL_BASED_OUTPATIENT_CLINIC_OR_DEPARTMENT_OTHER)
Admission: EM | Admit: 2017-07-25 | Discharge: 2017-07-25 | Disposition: A | Discharge status: Home / Self Care | Payer: Medicaid Other | Attending: Emergency Medicine | Admitting: Emergency Medicine

## 2017-07-25 ENCOUNTER — Encounter (HOSPITAL_BASED_OUTPATIENT_CLINIC_OR_DEPARTMENT_OTHER): Payer: Self-pay

## 2017-07-25 DIAGNOSIS — N760 Acute vaginitis: Secondary | ICD-10-CM | POA: Insufficient documentation

## 2017-07-25 DIAGNOSIS — L509 Urticaria, unspecified: Secondary | ICD-10-CM | POA: Insufficient documentation

## 2017-07-25 LAB — URINALYSIS, ROUTINE W REFLEX MICROSCOPIC
Bilirubin Urine: NEGATIVE
GLUCOSE, UA: NEGATIVE mg/dL
Hgb urine dipstick: NEGATIVE
KETONES UR: NEGATIVE mg/dL
Nitrite: NEGATIVE
PH: 6.5 (ref 5.0–8.0)
Protein, ur: NEGATIVE mg/dL
SPECIFIC GRAVITY, URINE: 1.021 (ref 1.005–1.030)

## 2017-07-25 LAB — URINALYSIS, MICROSCOPIC (REFLEX)

## 2017-07-25 LAB — PREGNANCY, URINE: Preg Test, Ur: NEGATIVE

## 2017-07-25 MED ORDER — HYDROCORTISONE 2.5 % EX LOTN: TOPICAL_LOTION | Freq: Two times a day (BID) | CUTANEOUS | 0 refills | Status: DC

## 2017-07-25 MED ORDER — PREDNISONE 50 MG PO TABS: 60.0000 mg | ORAL_TABLET | Freq: Once | ORAL | Status: DC

## 2017-07-25 MED ORDER — PREDNISONE 20 MG PO TABS: ORAL_TABLET | ORAL | 0 refills | Status: DC

## 2017-07-25 NOTE — ED Triage Notes (Signed)
C/o scattered rash x 2 days-NAD-steady gait 

## 2017-07-25 NOTE — ED Provider Notes (Signed)
[facility]   CSN: [id number] Arrival date & time: 07/25/17  1654  By signing my name below, I, Deland Pretty, attest that this documentation has been prepared under the direction and in the presence of Charlynne Pander, MD. Electronically Signed: Deland Pretty, ED Scribe. 07/25/17. 5:16 PM.  History   Chief Complaint Chief Complaint  Patient presents with  . Rash   The history is provided by the patient. No language interpreter was used.    HPI Comments: Lori Moses is a 22 y.o. female who presents to the Emergency Department complaining of a sudden onset of a generalized rash that began on 07/22/2017, and started on her abdomen before spreading. The pt has taken benadryl with inadequate relief. She also reports that she was seen at [facility] on 07/06/2017 and was prescribed metronidazole for diagnosed bacterial vaginosis. Patient states that the flagyl didn't help with her discharge but she denies any significant pelvic pain or purulent discharge. She denies dysuria, vaginal pain, chills, and fever. No recent tick bites   Past Medical History:  Diagnosis Date  . Medical history non-contributory   . PID (acute pelvic inflammatory disease) 05/2017    There are no active problems to display for this patient.   Past Surgical History:  Procedure Laterality Date  . HEART TUMOR EXCISION     as an infant    OB History    Gravida Para Term Preterm AB Living   0 0 0 0 0 0   SAB TAB Ectopic Multiple Live Births   0 0 0 0         Home Medications    Prior to Admission medications   Not on File    Family History No family history on file.  Social History Social History  Substance Use Topics  . Smoking status: Never Smoker  . Smokeless tobacco: Never Used  . Alcohol use No     Allergies   Other and Penicillins   Review of Systems Review of Systems  Constitutional: Negative for chills and fever.  Genitourinary:  Positive for pelvic pain and vaginal discharge. Negative for dysuria and vaginal pain.  Skin: Positive for rash.     Physical Exam Updated Vital Signs BP 137/81 (BP Location: Left Arm)   Pulse 96   Temp 98.9 F (37.2 C) (Oral)   Resp 20   Ht 5\' 7"  (1.702 m)   Wt 147 lb 11.3 oz (67 kg)   LMP 06/30/2017   SpO2 98%   BMI 23.13 kg/m   Physical Exam  Constitutional: She is oriented to person, place, and time. She appears well-developed and well-nourished.  HENT:  Head: Normocephalic.  Right Ear: Tympanic membrane normal.  Left Ear: Tympanic membrane normal.  No oral lesions.  Eyes: EOM are normal.  Neck: Normal range of motion.  Pulmonary/Chest: Effort normal.  Abdominal: She exhibits no distension.  Musculoskeletal: Normal range of motion.  Neurological: She is alert and oriented to person, place, and time.  Skin: Rash noted.  Maculopapular rash mainly on the face. No web space involvement.  Psychiatric: She has a normal mood and affect.  Nursing note and vitals reviewed.    ED Treatments / Results   DIAGNOSTIC STUDIES: Oxygen Saturation is 98% on RA, normal by my interpretation.   COORDINATION OF CARE: 5:14 PM-Discussed next steps with pt. Pt verbalized understanding and is agreeable with the plan.   Labs (all labs ordered are listed, but only abnormal results are  displayed) Labs Reviewed - No data to display  EKG  EKG Interpretation None       Radiology No results found.  Procedures Procedures (including critical care time)  Medications Ordered in ED Medications - No data to display   Initial Impression / Assessment and Plan / ED Course  I have reviewed the triage vital signs and the nursing notes.  Pertinent labs & imaging results that were available during my care of the patient were reviewed by me and considered in my medical decision making (see chart for details).    Lori Moses is a 21 y.o. female here with rash after taking flagyl.  Consider drug reaction vs environmental allergies vs viral. Well appearing. No signs of steven Johnson. No webspace involvement or MM involvement. She still has vaginal discharge despite BV but states that its improving. UA showed some bacteria but she is asymptomatic. I think the risk of antibiotic causing allergic reaction to become worse outweighs the benefits. Will send off urine culture. Will start prednisone, continue benadryl for allergic reaction.    Final Clinical Impressions(s) / ED Diagnoses   Final diagnoses:  None    New Prescriptions New Prescriptions   No medications on file   I personally performed the services described in this documentation, which was scribed in my presence. The recorded information has been reviewed and is accurate.     Charlynne Pander, MD 07/25/17 [phone]

## 2017-07-25 NOTE — Discharge Instructions (Signed)
Take prednisone as prescribed.   Take benadryl 25 mg every 6 hrs as needed for itchiness.   You can try hydrocortisone lotion on the rash for itchiness.   See your doctor. Consider allergy testing when you feel better  Return to ER if you have throat closing, worse rash, trouble breathing.

## 2017-07-27 LAB — URINE CULTURE

## 2017-07-29 ENCOUNTER — Inpatient Hospital Stay (HOSPITAL_COMMUNITY)
Admission: AD | Admit: 2017-07-29 | Discharge: 2017-07-30 | Disposition: A | Discharge status: Home / Self Care | Payer: Medicaid Other | Source: Ambulatory Visit | Attending: Obstetrics and Gynecology | Admitting: Obstetrics and Gynecology

## 2017-07-29 DIAGNOSIS — M545 Low back pain, unspecified: Secondary | ICD-10-CM

## 2017-07-29 DIAGNOSIS — L509 Urticaria, unspecified: Secondary | ICD-10-CM | POA: Insufficient documentation

## 2017-07-29 DIAGNOSIS — Z880 Allergy status to penicillin: Secondary | ICD-10-CM | POA: Insufficient documentation

## 2017-07-29 DIAGNOSIS — R0789 Other chest pain: Secondary | ICD-10-CM | POA: Insufficient documentation

## 2017-07-29 MED ORDER — GI COCKTAIL ~~LOC~~
30.0000 mL | Freq: Once | ORAL | Status: AC
  Administered 2017-07-30: 30 mL via ORAL
  Filled 2017-07-29: qty 30

## 2017-07-29 MED ORDER — HYDROXYZINE HCL 50 MG/ML IM SOLN
25.0000 mg | Freq: Once | INTRAMUSCULAR | Status: AC
  Administered 2017-07-30: 25 mg via INTRAMUSCULAR
  Filled 2017-07-29: qty 0.5

## 2017-07-29 MED ORDER — IBUPROFEN 600 MG PO TABS
600.0000 mg | ORAL_TABLET | Freq: Once | ORAL | Status: AC
  Administered 2017-07-30: 600 mg via ORAL
  Filled 2017-07-29: qty 1

## 2017-07-29 NOTE — MAU Note (Addendum)
Pt presents with hives on back, legs and arms. Pt states she think it was from antibiotics from UTI and PID. States she was given prednisone by hospital, but states it is making everything worse. Pt also has c/o chest pain that just started before she got into triage. Feels "achy and throbbing." Pt denies SOB or cardiac issues.

## 2017-07-29 NOTE — MAU Provider Note (Signed)
Chief Complaint:  Generalized Body Aches; Urticaria; and Chest Pain   First Provider Initiated Contact with Patient 07/29/17 2343    HPI: Lori Moses is a 21 y.o. G0P0000 who presents to maternity admissions reporting hives, itching, body aches and bilateral chest pain..  States her chest pain "doesn't really worry me".  It is a chest wall soreness bilaterally from nipples down and wraps around to her lower back. Is most concerned that the hives are worse. Has been scratching frequently She reports no vaginal bleeding, vaginal itching/burning, urinary symptoms, h/a, dizziness, n/v, or fever/chills.    Urticaria  This is a recurrent problem. The current episode started in the past 7 days. The problem has been gradually worsening since onset. The rash is diffuse. The rash is characterized by itchiness. Associated with: antibiotic. Pertinent negatives include no anorexia, congestion, cough, diarrhea, facial edema, fatigue, fever, rhinorrhea, shortness of breath, sore throat or vomiting. Past treatments include oral steroids. The treatment provided no relief.  Chest Pain   This is a new problem. The current episode started in the past 7 days. The onset quality is gradual. The problem occurs intermittently. The problem has been unchanged. Pain location: chest wall from breasts down bilat. The pain is mild. Quality: achy. The pain radiates to the lower back. Associated symptoms include back pain. Pertinent negatives include no abdominal pain, cough, fever, irregular heartbeat, leg pain, lower extremity edema, malaise/fatigue, nausea, near-syncope, numbness, orthopnea, palpitations, shortness of breath or vomiting. The pain is aggravated by movement. She has tried nothing for the symptoms. There are no known risk factors.  Pertinent negatives for past medical history include no CAD.    RN note: Pt presents with hives on back, legs and arms. Pt states she think it was from antibiotics from UTI and PID.  States she was given prednisone by hospital, but states it is making everything worse. Pt also has c/o chest pain that just started before she got into triage. Feels "achy and throbbing." Pt denies SOB or cardiac issues.  Past Medical History: Past Medical History:  Diagnosis Date  . Medical history non-contributory   . PID (acute pelvic inflammatory disease) 05/2017    Past obstetric history: OB History  Gravida Para Term Preterm AB Living  0 0 0 0 0 0  SAB TAB Ectopic Multiple Live Births  0 0 0 0          Past Surgical History: Past Surgical History:  Procedure Laterality Date  . HEART TUMOR EXCISION     as an infant    Family History: No family history on file.  Social History: Social History  Substance Use Topics  . Smoking status: Never Smoker  . Smokeless tobacco: Never Used  . Alcohol use No    Allergies:  Allergies  Allergen Reactions  . Other Hives    potatoes  . Penicillins Hives    Has patient had a PCN reaction causing immediate rash, facial/tongue/throat swelling, SOB or lightheadedness with hypotension: Yes Has patient had a PCN reaction causing severe rash involving mucus membranes or skin necrosis: No Has patient had a PCN reaction that required hospitalization No Has patient had a PCN reaction occurring within the last 10 years: No If all of the above answers are "NO", then may proceed with Cephalosporin use.     Meds:  Prescriptions Prior to Admission  Medication Sig Dispense Refill Last Dose  . hydrocortisone 2.5 % lotion Apply topically 2 (two) times daily. 59 mL 0   .  predniSONE (DELTASONE) 20 MG tablet Take 60 mg daily x 2 days then 40 mg daily x 2 days then 20 mg daily x 2 days 12 tablet 0     I have reviewed patient's Past Medical Hx, Surgical Hx, Family Hx, Social Hx, medications and allergies.  ROS:  Review of Systems  Constitutional: Negative for fatigue, fever and malaise/fatigue.  HENT: Negative for congestion, rhinorrhea  and sore throat.   Respiratory: Negative for cough and shortness of breath.   Cardiovascular: Positive for chest pain. Negative for palpitations, orthopnea and near-syncope.  Gastrointestinal: Negative for abdominal pain, anorexia, diarrhea, nausea and vomiting.  Musculoskeletal: Positive for back pain.  Neurological: Negative for numbness.   Other systems negative     Physical Exam  Patient Vitals for the past 24 hrs:  BP Temp Temp src Pulse Resp SpO2 Height Weight  07/29/17 2334 126/76 98.6 F (37 C) Oral 65 19 98 % 5\' 7"  (1.702 m) 146 lb (66.2 kg)   Constitutional: Well-developed, well-nourished female in no acute distress.  Cardiovascular: normal rate and rhythm, no ectopy audible, S1 & S2 heard, no murmur Respiratory: normal effort, no distress. Lungs CTAB with no wheezes or crackles GI: Abd soft, non-tender.  Nondistended.  No rebound, No guarding.  Bowel Sounds audible  MS: Extremities nontender, no edema, normal ROM Neurologic: Alert and oriented x 4.   Grossly nonfocal. GU: Neg CVAT. Skin:  Warm and Dry   There are a few scattered raised hives in creases of elbows and back of arms.  No erethema or lesions. Psych:  Affect appropriate.  PELVIC EXAM: not indicated   Labs: No results found for this or any previous visit (from the past 24 hour(s)).    Imaging:  EKG done which showed NSR with rightward axis  MAU Course/MDM: I have ordered labs as follows:  12 lead EKG Imaging ordered: none Results reviewed.   Consult Dr Emelda Fear. He states since EKG is normal and pt is so [place], and since the chest pain seems to be musculoskeletal we do not need to pursue a full cardiac workup . Agrees with trying Vistaril.   Treatments in MAU included Vistaril with complete resolution of itching.   Discussed will try some Flexeril (low dose) for back pain and MS pain in lower chest..   Pt stable at time of discharge.  Assessment: Hives - Plan: Discharge patient  Bilateral low back  pain without sciatica, unspecified chronicity - Plan: Discharge patient  Chest wall pain - Plan: Discharge patient   Plan: Discharge home Recommend Continue steroids as prescribed.  Vistaril PRN with driving precautions Rx sent for Vistaril for itching Rx Flexeril for back pain   Encouraged to return here or to other Urgent Care/ED if she develops worsening of symptoms, increase in pain, fever, or other concerning symptoms.   Wynelle Bourgeois CNM, MSN Certified Nurse-Midwife 07/29/2017 11:44 PM

## 2017-07-30 DIAGNOSIS — L509 Urticaria, unspecified: Secondary | ICD-10-CM

## 2017-07-30 MED ORDER — HYDROXYZINE HCL 25 MG PO TABS: 25.0000 mg | ORAL_TABLET | Freq: Four times a day (QID) | ORAL | 0 refills | Status: DC | PRN

## 2017-07-30 MED ORDER — CYCLOBENZAPRINE HCL 5 MG PO TABS: 5.0000 mg | ORAL_TABLET | Freq: Three times a day (TID) | ORAL | 0 refills | Status: DC | PRN

## 2017-07-30 NOTE — Discharge Instructions (Signed)
Chest Wall Pain Chest wall pain is pain in or around the bones and muscles of your chest. Sometimes, an injury causes this pain. Sometimes, the cause may not be known. This pain may take several weeks or longer to get better. Follow these instructions at home: Pay attention to any changes in your symptoms. Take these actions to help with your pain:  Rest as told by your health care provider.  Avoid activities that cause pain. These include any activities that use your chest muscles or your abdominal and side muscles to lift heavy items.  If directed, apply ice to the painful area: ? Put ice in a plastic bag. ? Place a towel between your skin and the bag. ? Leave the ice on for 20 minutes, 2-3 times per day.  Take over-the-counter and prescription medicines only as told by your health care provider.  Do not use tobacco products, including cigarettes, chewing tobacco, and e-cigarettes. If you need help quitting, ask your health care provider.  Keep all follow-up visits as told by your health care provider. This is important.  Contact a health care provider if:  You have a fever.  Your chest pain becomes worse.  You have new symptoms. Get help right away if:  You have nausea or vomiting.  You feel sweaty or light-headed.  You have a cough with phlegm (sputum) or you cough up blood.  You develop shortness of breath. This information is not intended to replace advice given to you by your health care provider. Make sure you discuss any questions you have with your health care provider. Document Released: 11/22/2005 Document Revised: 04/01/2016 Document Reviewed: 02/17/2015 Elsevier Interactive Patient Education  2017 [employer] Back Pain, Adult Back pain is very common in adults.The cause of back pain is rarely dangerous and the pain often gets better over time.The cause of your back pain may not be known. Some common causes of back pain include:  Strain of the muscles or  ligaments supporting the spine.  Wear and tear (degeneration) of the spinal disks.  Arthritis.  Direct injury to the back.  For many people, back pain may return. Since back pain is rarely dangerous, most people can learn to manage this condition on their own. Follow these instructions at home: Watch your back pain for any changes. The following actions may help to lessen any discomfort you are feeling:  Remain active. It is stressful on your back to sit or stand in one place for long periods of time. Do not sit, drive, or stand in one place for more than 30 minutes at a time. Take short walks on even surfaces as soon as you are able.Try to increase the length of time you walk each day.  Exercise regularly as directed by your health care provider. Exercise helps your back heal faster. It also helps avoid future injury by keeping your muscles strong and flexible.  Do not stay in bed.Resting more than 1-2 days can delay your recovery.  Pay attention to your body when you bend and lift. The most comfortable positions are those that put less stress on your recovering back. Always use proper lifting techniques, including: ? Bending your knees. ? Keeping the load close to your body. ? Avoiding twisting.  Find a comfortable position to sleep. Use a firm mattress and lie on your side with your knees slightly bent. If you lie on your back, put a pillow under your knees.  Avoid feeling anxious or stressed.Stress increases muscle tension  and can worsen back pain.It is important to recognize when you are anxious or stressed and learn ways to manage it, such as with exercise.  Take medicines only as directed by your health care provider. Over-the-counter medicines to reduce pain and inflammation are often the most helpful.Your health care provider may prescribe muscle relaxant drugs.These medicines help dull your pain so you can more quickly return to your normal activities and healthy  exercise.  Apply ice to the injured area: ? Put ice in a plastic bag. ? Place a towel between your skin and the bag. ? Leave the ice on for 20 minutes, 2-3 times a day for the first 2-3 days. After that, ice and heat may be alternated to reduce pain and spasms.  Maintain a healthy weight. Excess weight puts extra stress on your back and makes it difficult to maintain good posture.  Contact a health care provider if:  You have pain that is not relieved with rest or medicine.  You have increasing pain going down into the legs or buttocks.  You have pain that does not improve in one week.  You have night pain.  You lose weight.  You have a fever or chills. Get help right away if:  You develop new bowel or bladder control problems.  You have unusual weakness or numbness in your arms or legs.  You develop nausea or vomiting.  You develop abdominal pain.  You feel faint. This information is not intended to replace advice given to you by your health care provider. Make sure you discuss any questions you have with your health care provider. Document Released: 11/22/2005 Document Revised: 04/01/2016 Document Reviewed: 03/26/2014 Elsevier Interactive Patient Education  2017 [employer] Angioedema Angioedema is the sudden swelling of tissue in the body. Angioedema can affect any part of the body, but it most often affects the deeper parts of the skin, causing red, itchy patches (hives) to appear over the affected area. It often begins during the night and is found in the morning. Depending on the cause, angioedema may happen:  Only once.  Several times. It may come back in unpredictable patterns.  Repeatedly for several years. Over time, it may gradually stop coming back.  Angioedema can be life-threatening if it affects the air passages that you breathe through. What are the causes? This condition may be caused by:  Foods, such as milk, eggs, shellfish, wheat, or  nuts.  Certain medicines, such as ACE inhibitors, antibiotics, nonsteroidal anti-inflammatory drugs, birth control pills, or dyes used in X-rays.  Insect stings.  Infections.  Angioedema can be inherited, and episodes can be triggered by:  Mild injury.  Dental work.  Surgery.  Stress.  Sudden changes in temperature.  Exercise.  In some cases, the cause of this condition is not known. What are the signs or symptoms? Symptoms of this condition depend on where the swelling happens. Symptoms may include:  Swollen skin.  Red, itchy patches of skin (hives).  Redness in the affected area.  Pain in the affected area.  Swollen lips or tongue.  Wheezing.  Breathing problems.  If your internal organs are involved, symptoms may also include:  Nausea.  Abdominal pain.  Vomiting.  Difficulty swallowing.  Difficulty passing urine.  How is this diagnosed? This condition may be diagnosed based on:  An exam of the affected area.  Your medical history.  Whether anyone in your family has had this condition before.  A review of any medicines you have been  taking.  Tests, including: ? Allergy skin tests to see if the condition was caused by an allergic reaction. ? Blood tests to see if the condition was caused by a gene. ? Tests to check for underlying diseases that could cause the condition.  How is this treated? Treatment for this condition depends on the cause. It may involve any of the following:  If something triggered the condition, making changes to keep it from triggering the condition again.  If the condition affects your breathing, having tubes placed in your airway to keep it open.  Taking medicines to treat symptoms or prevent future episodes. These may include: ? Antihistamines. ? Epinephrine injections. ? Steroids.  If your condition is severe, you may need to be treated at the hospital. Angioedema usually gets better in 24-48 hours. Follow  these instructions at home:  Take over-the-counter and prescription medicines only as told by your health care provider.  If you were given medicines for emergency allergy treatment, always carry them with you.  Wear a medical bracelet as told by your health care provider.  If something triggers your condition, avoid the trigger, if possible.  If your condition is inherited and you are thinking about having children, talk to your health care provider. It is important to discuss the risks of passing on the condition to your children. Contact a health care provider if:  You have repeated episodes of angioedema.  Episodes of angioedema start to happen more often than they used to, even after you take steps to prevent them.  You have episodes of angioedema that are more severe than they have been before, even after you take steps to prevent them.  You are thinking about having children. Get help right away if:  You have severe swelling of your mouth, tongue, or lips.  You have trouble breathing.  You have trouble swallowing.  You faint. This information is not intended to replace advice given to you by your health care provider. Make sure you discuss any questions you have with your health care provider. Document Released: 01/31/2002 Document Revised: 06/19/2016 Document Reviewed: 06/01/2016 Elsevier Interactive Patient Education  [employer].

## 2017-07-30 NOTE — MAU Note (Signed)
Pt signed paper copy of AVS.  

## 2017-08-05 ENCOUNTER — Encounter (HOSPITAL_COMMUNITY): Payer: Self-pay | Admitting: *Deleted

## 2017-08-05 ENCOUNTER — Inpatient Hospital Stay (HOSPITAL_COMMUNITY)
Admission: AD | Admit: 2017-08-05 | Discharge: 2017-08-05 | Disposition: A | Discharge status: Home / Self Care | Payer: Medicaid Other | Source: Ambulatory Visit | Attending: Obstetrics & Gynecology | Admitting: Obstetrics & Gynecology

## 2017-08-05 DIAGNOSIS — Z3202 Encounter for pregnancy test, result negative: Secondary | ICD-10-CM

## 2017-08-05 DIAGNOSIS — Z880 Allergy status to penicillin: Secondary | ICD-10-CM | POA: Insufficient documentation

## 2017-08-05 LAB — URINALYSIS, ROUTINE W REFLEX MICROSCOPIC
Bilirubin Urine: NEGATIVE
Glucose, UA: NEGATIVE mg/dL
Hgb urine dipstick: NEGATIVE
Ketones, ur: NEGATIVE mg/dL
LEUKOCYTES UA: NEGATIVE
Nitrite: NEGATIVE
PROTEIN: NEGATIVE mg/dL
Specific Gravity, Urine: 1.015 (ref 1.005–1.030)
pH: 7 (ref 5.0–8.0)

## 2017-08-05 LAB — POCT PREGNANCY, URINE: PREG TEST UR: NEGATIVE

## 2017-08-05 NOTE — MAU Note (Signed)
Pt reports the rash she was seen for last week has continued and is spreading. Also reports she has had a fever, cough, a backache, and nausea. States her period started but was light and only lasted 3 days.

## 2017-08-05 NOTE — MAU Provider Note (Signed)
History     CSN: [id number]  Arrival date and time: 08/05/17 1022   None     No chief complaint on file.  HPI   Ms.Lori Moses is  21 y.o. female G0P0000 here with concerns about pregnancy, says she started her period at the normal time on Sunday, however her period was lighter than normal and only lasted 3 days. She did not take a pregnancy test at home. Has had frequent visits to the MAU and ER.  She was seen for a rash on 8/24 here in MAU and continues to have the rash and wants it looked at. She does not have the rash currently, she has a picture of the rash on her phone. The rash comes and goes. She has stopped the medication for BV thinking this may be the cause of the rash.   OB History    Gravida Para Term Preterm AB Living   0 0 0 0 0 0   SAB TAB Ectopic Multiple Live Births   0 0 0 0        Past Medical History:  Diagnosis Date  . Medical history non-contributory   . PID (acute pelvic inflammatory disease) 05/2017    Past Surgical History:  Procedure Laterality Date  . HEART TUMOR EXCISION     as an infant    No family history on file.  Social History  Substance Use Topics  . Smoking status: Never Smoker  . Smokeless tobacco: Never Used  . Alcohol use No    Allergies:  Allergies  Allergen Reactions  . Other Hives    potatoes  . Penicillins Hives    Has patient had a PCN reaction causing immediate rash, facial/tongue/throat swelling, SOB or lightheadedness with hypotension: Yes Has patient had a PCN reaction causing severe rash involving mucus membranes or skin necrosis: No Has patient had a PCN reaction that required hospitalization No Has patient had a PCN reaction occurring within the last 10 years: No If all of the above answers are "NO", then may proceed with Cephalosporin use.     Prescriptions Prior to Admission  Medication Sig Dispense Refill Last Dose  . cyclobenzaprine (FLEXERIL) 5 MG tablet Take 1 tablet (5 mg total) by mouth 3  (three) times daily as needed for muscle spasms. 30 tablet 0   . hydrocortisone 2.5 % lotion Apply topically 2 (two) times daily. 59 mL 0   . hydrOXYzine (ATARAX/VISTARIL) 25 MG tablet Take 1 tablet (25 mg total) by mouth every 6 (six) hours as needed for itching. 30 tablet 0   . predniSONE (DELTASONE) 20 MG tablet Take 60 mg daily x 2 days then 40 mg daily x 2 days then 20 mg daily x 2 days 12 tablet 0    Results for orders placed or performed during the hospital encounter of 08/05/17 (from the past 48 hour(s))  Urinalysis, Routine w reflex microscopic     Status: Abnormal   Collection Time: 08/05/17 10:28 AM  Result Value Ref Range   Color, Urine STRAW (A) YELLOW   APPearance CLEAR CLEAR   Specific Gravity, Urine 1.015 1.005 - 1.030   pH 7.0 5.0 - 8.0   Glucose, UA NEGATIVE NEGATIVE mg/dL   Hgb urine dipstick NEGATIVE NEGATIVE   Bilirubin Urine NEGATIVE NEGATIVE   Ketones, ur NEGATIVE NEGATIVE mg/dL   Protein, ur NEGATIVE NEGATIVE mg/dL   Nitrite NEGATIVE NEGATIVE   Leukocytes, UA NEGATIVE NEGATIVE  Pregnancy, urine POC     Status:  None   Collection Time: 08/05/17 10:49 AM  Result Value Ref Range   Preg Test, Ur NEGATIVE NEGATIVE    Comment:        THE SENSITIVITY OF THIS METHODOLOGY IS >24 mIU/mL    Review of Systems  Constitutional: Negative for fever.  Gastrointestinal: Negative for abdominal pain.  Genitourinary: Negative for vaginal bleeding and vaginal discharge.   Physical Exam   Blood pressure 119/68, pulse 76, temperature 98.5 F (36.9 C), temperature source Oral, resp. rate 16, height 5\' 7"  (1.702 m), weight 143 lb (64.9 kg), last menstrual period 07/31/2017, SpO2 100 %.  Physical Exam  Constitutional: She is oriented to person, place, and time. She appears well-developed and well-nourished. No distress.  HENT:  Head: Normocephalic.  Eyes: Pupils are equal, round, and reactive to light.  Respiratory: Effort normal.  Musculoskeletal: Normal range of motion.   Neurological: She is alert and oriented to person, place, and time.  Skin: Skin is warm. She is not diaphoretic.  Psychiatric: Her behavior is normal.   MAU Course  Procedures  None  MDM  Urine pregnancy test negative   Assessment and Plan   A:  1. Encounter for pregnancy test with result negative     P:  Discharge home in stable condition Discussed appropriate use of the MAU If rash returns go to Urgent care of dermatology At home pregnancy tests strongly encouraged.    Venia Carbon I, NP 08/05/2017 2:23 PM

## 2017-08-05 NOTE — Discharge Instructions (Signed)
Human Chorionic Gonadotropin Test °Human chorionic gonadotropin (hCG) is a hormone produced during pregnancy by the cells that form the placenta. The placenta is the organ that grows inside your womb (uterus) to nourish a developing baby. When you are pregnant, hCG starts to appear in your blood about 11 days after conception. It continues to go up for the first 8-11 weeks of pregnancy. °Your hCG level can be measured with several different types of tests. You may have: °· A urine test. °? hCG is eliminated from your body by your kidneys, so a urine test is one way to check for this hormone. °? A urine test only shows whether there is hCG in your urine. It does not measure how much. °? You may have a urine test to find out whether you are pregnant. °? A home pregnancy test detects whether there is hCG in your urine. °· A qualitative blood test. °? Like the urine test, this blood test only shows whether there is hCG in your blood. It does not measure how much. °? You may have this type of blood test to find out whether you are pregnant. °· A quantitative blood test. °? This type of blood test measures the amount of hCG in your blood. °? You may have this type of test to diagnose an abnormal pregnancy or determine whether you are at risk of, or have had, a failed pregnancy (miscarriage). ° °How do I prepare for this test? °For the urine test: °· Limit your fluid intake before the urine test as directed by your health care provider. °· Collect the sample the first time you urinate in the morning. °· Let your health care provider know if you have blood in your urine. This may interfere with the test result. ° °Some medicines may interfere with the urine and blood tests. Let your health care provider know about all the medicines you are taking. No additional preparation is required for the blood test. °What do the results mean? °It is your responsibility to obtain your test results. Ask the lab or department performing  the test when and how you will get your results. Talk to your health care provider if you have any questions about your test results. °The results of the hCG urine test and the qualitative hCG blood test are either positive or negative. The results of the quantitative hCG blood test are reported as a number. hCG is measured in international units per liter (IU/L). °Meaning of Negative Test Results °A negative result on a urine or qualitative blood test could mean that you are not pregnant. It could also mean the test was done too early to detect hCG. If you still have other signs of pregnancy, the test should be repeated. °Meaning of Positive Test Results °A positive result on the urine or qualitative blood tests means you are most likely pregnant. Your health care provider may confirm your pregnancy with an imaging study of the inside of your uterus at 5-6 weeks (ultrasound). °Range of Normal Values °Ranges for normal values for the quantitative hCG blood test may vary among different labs and hospitals. You should always check with your health care provider after having lab work or other tests done to discuss whether your values are considered within normal limits. °· Less than 5 IU/L means it is most likely you are not pregnant. °· Greater than 25 IU/L means it is most likely you are pregnant. ° °Meaning of Results Outside Normal Value Ranges °If your hCG   level on the quantitative test is not what would be expected, you may have the test again. It may also be important for your health care provider to know whether your hCG level goes up or down over time. Common causes of results outside the normal range include:  Being pregnant with twins (hCG level is higher than expected).  Having an ectopic pregnancy (hCG rises more slowly than expected).  Miscarriage (hCG level falls).  Abnormal growths in the womb (hCG level is higher than expected).  Talk with your health care provider to discuss your results,  treatment options, and if necessary, the need for more tests. Talk with your health care provider if you have any questions about your results. This information is not intended to replace advice given to you by your health care provider. Make sure you discuss any questions you have with your health care provider. Document Released: 12/24/2004 Document Revised: 07/28/2016 Document Reviewed: 02/26/2014 Elsevier Interactive Patient Education  2018 [employer].  Pregnancy Test Information What is a pregnancy test? A pregnancy test is used to detect the presence of human chorionic gonadotropin (hCG) in a sample of your urine or blood. hCG is a hormone produced by the cells of the placenta. The placenta is the organ that forms to nourish and support a developing baby. This test requires a sample of either blood or urine. A pregnancy test determines whether you are pregnant or not. How are pregnancy tests done? Pregnancy tests are done using a home pregnancy test or having a blood or urine test done at your health care provider's office. Home pregnancy tests require a urine sample.  Most kits use a plastic testing device with a strip of paper that indicates whether there is hCG in your urine.  Follow the test instructions very carefully.  After you urinate on the test stick, markings will appear to let you know whether you are pregnant.  For best results, use your first urine of the morning. That is when the concentration of hCG is highest.  Having a blood test to check for pregnancy requires a sample of blood drawn from a vein in your hand or arm. Your health care provider will send your sample to a lab for testing. Results of a pregnancy test will be positive or negative. Is one type of pregnancy test better than another? In some cases, a blood test will return a positive result even if a urine test was negative because blood tests are more sensitive. This means blood tests can detect hCG earlier  than home pregnancy tests. How accurate are home pregnancy tests? Both types of pregnancy tests are very accurate.  A blood test is about 98% accurate.  When you are far enough along in your pregnancy and when used correctly, home pregnancy tests are equally accurate.  Can anything interfere with home pregnancy test results It is possible for certain conditions to cause an inaccurate test result (false positive or falsenegative).  A false positive is a positive test result when you are not pregnant. This can happen if you: ? Are taking certain medicines, including anticonvulsants or tranquilizers. ? Have certain proteins in your blood.  A false negative is a negative test result when you are pregnant. This can happen if you: ? Took the test before there was enough hCG to detect. A pregnancy test will not be positive in most women until 3-4 weeks after conception. ? Drank a lot of liquid before the test. Diluted urine samples can sometimes  give an inaccurate result. ? Take certain medicines, such as water pills (diuretics) or some antihistamines.  What should I do if I have a positive pregnancy test? If you have a positive pregnancy test, schedule an appointment with your health care provider. You might need additional testing to confirm the pregnancy. In the meantime, begin taking a prenatal vitamin, stop smoking, stop drinking alcohol, and do not use street drugs. Talk to your health care provider about how to take care of yourself during your pregnancy. Ask about what to expect from the care you will need throughout pregnancy (prenatal care). This information is not intended to replace advice given to you by your health care provider. Make sure you discuss any questions you have with your health care provider. Document Released: 11/25/2003 Document Revised: 10/19/2016 Document Reviewed: 03/19/2014 Elsevier Interactive Patient Education  2017 [employer]

## 2017-08-06 ENCOUNTER — Encounter (HOSPITAL_COMMUNITY): Payer: Self-pay | Admitting: Emergency Medicine

## 2017-08-06 ENCOUNTER — Ambulatory Visit (HOSPITAL_COMMUNITY)
Admission: EM | Admit: 2017-08-06 | Discharge: 2017-08-06 | Disposition: A | Discharge status: Home / Self Care | Payer: Self-pay

## 2017-08-06 DIAGNOSIS — R0789 Other chest pain: Secondary | ICD-10-CM

## 2017-08-06 DIAGNOSIS — K219 Gastro-esophageal reflux disease without esophagitis: Secondary | ICD-10-CM

## 2017-08-06 MED ORDER — RANITIDINE HCL 300 MG PO TABS: 300.0000 mg | ORAL_TABLET | Freq: Every day | ORAL | 2 refills | Status: DC

## 2017-08-06 MED ORDER — OMEPRAZOLE 40 MG PO CPDR: 40.0000 mg | DELAYED_RELEASE_CAPSULE | Freq: Two times a day (BID) | ORAL | 0 refills | Status: DC

## 2017-08-06 NOTE — ED Notes (Signed)
Urine specimen in lab 

## 2017-08-06 NOTE — Discharge Instructions (Signed)
Continue the ibuprofen as prescribed for pain, no also started you on antiacids as well. Take these as prescribed, and if pain persists follow up with her primary care. If at any time your pain worsens, go to the emergency room

## 2017-08-06 NOTE — ED Triage Notes (Signed)
Pt here for intermittent CP onset 2 weeks associated w/dyspnea and hives  Believes it might be due to her meds she is on for BV (Flagyl) and an antibiotic for UTI  Here b/c her mother is seen and decided to get seen as well  A&O x4... NAD... Ambulatory

## 2017-08-06 NOTE — ED Provider Notes (Signed)
[facility]   [id number] 08/06/17 Arrival Time: 1816   SUBJECTIVE:  Lori Moses is a 21 y.o. female who presents to the urgent care with complaint of a feeling of chest tightness. This is been ongoing for more than 2 weeks, she was previously evaluated in the emergency department, diagnosed with musculoskeletal pain, and started on ibuprofen. She states her ibuprofen has helped, but the pain continues to recur. Pain is described as being in the center of the chest, nonradiating, it is not worsened with exertion, and not improved with rest. She also complains of bloating and gassy sensation. She does not smoke, family history both parents are alive and well, no history of heart disease, diabetes, hypertension, coronary artery disease     Past Medical History:  Diagnosis Date  . Medical history non-contributory   . PID (acute pelvic inflammatory disease) 05/2017   Social History   Social History  . Marital status: Single    Spouse name: N/A  . Number of children: N/A  . Years of education: N/A   Occupational History  . Not on file.   Social History Main Topics  . Smoking status: Never Smoker  . Smokeless tobacco: Never Used  . Alcohol use No  . Drug use: No  . Sexual activity: Yes    Birth control/ protection: Condom   Other Topics Concern  . Not on file   Social History Narrative  . No narrative on file   Current Meds  Medication Sig  . cetirizine (ZYRTEC) 10 MG tablet Take 10 mg by mouth daily.   Allergies  Allergen Reactions  . Other Hives    potatoes  . Penicillins Hives    Has patient had a PCN reaction causing immediate rash, facial/tongue/throat swelling, SOB or lightheadedness with hypotension: Yes Has patient had a PCN reaction causing severe rash involving mucus membranes or skin necrosis: No Has patient had a PCN reaction that required hospitalization No Has patient had a PCN reaction occurring within the last 10 years: No If all of the  above answers are "NO", then may proceed with Cephalosporin use.       ROS: As per HPI, remainder of ROS negative.   OBJECTIVE:  Vitals:   08/06/17 1909  BP: 110/70  Pulse: 87  Resp: 20  Temp: 98.5 F (36.9 C)  TempSrc: Oral  SpO2: 91%       General Appearance:  awake, alert, oriented, in no acute distress, well developed, well nourished and in no acute distress Skin:  skin color, texture, turgor are normal Ears:  External- bilateral-  normal Neck:  neck- supple, no mass, non-tender and no jvd Back:  no pain to palpation Lungs:  Normal expansion.  Clear to auscultation.  No rales, rhonchi, or wheezing. Heart:  Heart sounds are normal.  Regular rate and rhythm without murmur, gallop or rub. Abdomen:  Soft, non-tender, normal bowel sounds; no bruits, organomegaly or masses. Musculoskeletal:  negative Peripheral Pulses:  Capillary refill <2secs, strong peripheral pulses Psych exam:alert,oriented, in NAD with a full range of affect, normal behavior and no psychotic features      ASSESSMENT & PLAN:  1. Chest wall pain   2. Gastroesophageal reflux disease, esophagitis presence not specified     Meds ordered this encounter  Medications  . cetirizine (ZYRTEC) 10 MG tablet    Sig: Take 10 mg by mouth daily.  Marland Kitchen omeprazole (PRILOSEC) 40 MG capsule    Sig: Take 1 capsule (40 mg total) by  mouth 2 (two) times daily.    Dispense:  28 capsule    Refill:  0    Order Specific Question:   Supervising Provider    Answer:   Eustace MooreMURRAY, LAURA W [[id number]]  . ranitidine (ZANTAC) 300 MG tablet    Sig: Take 1 tablet (300 mg total) by mouth at bedtime.    Dispense:  30 tablet    Refill:  2    Order Specific Question:   Supervising Provider    Answer:   Eustace MooreMURRAY, LAURA W [[id number]]    Please most likely diagnosis could be reflux will start on antiacids, counseled to go to the ER at any time symptoms worsen, follow up with primary care.  Reviewed expectations re: course of current medical  issues. Questions answered. Outlined signs and symptoms indicating need for more acute intervention. Patient verbalized understanding. After Visit Summary given.    Procedures:  12-lead EKG obtained, when compared to previous EKG dated 07/29/2017. There are no changes between these EKGs. Ventricular rate 76, PR interval 134 ms, QRS interval 80 ms, she demonstrates a normal access, was a normal sinus rhythm with possible sinus arrhythmia. There is no elevation, no ectopy noted.  Labs:   Labs Reviewed - No data to display  No results found.           Dorena BodoKennard, Neeka Urista, NP 08/06/17 2054

## 2017-08-19 ENCOUNTER — Encounter: Payer: Self-pay | Admitting: Family

## 2017-09-16 ENCOUNTER — Encounter: Payer: Self-pay | Admitting: Family

## 2017-09-16 ENCOUNTER — Ambulatory Visit (INDEPENDENT_AMBULATORY_CARE_PROVIDER_SITE_OTHER): Payer: Medicaid Other | Admitting: Family

## 2017-09-16 ENCOUNTER — Other Ambulatory Visit (HOSPITAL_COMMUNITY)
Admission: RE | Admit: 2017-09-16 | Discharge: 2017-09-16 | Disposition: A | Discharge status: Home / Self Care | Payer: Medicaid Other | Source: Ambulatory Visit | Attending: Family | Admitting: Family

## 2017-09-16 VITALS — BP 118/78 | HR 97 | Temp 97.9°F | Ht 67.0 in | Wt 147.8 lb

## 2017-09-16 DIAGNOSIS — Z01419 Encounter for gynecological examination (general) (routine) without abnormal findings: Secondary | ICD-10-CM

## 2017-09-16 DIAGNOSIS — Z23 Encounter for immunization: Secondary | ICD-10-CM

## 2017-09-16 DIAGNOSIS — Z113 Encounter for screening for infections with a predominantly sexual mode of transmission: Secondary | ICD-10-CM | POA: Diagnosis not present

## 2017-09-16 DIAGNOSIS — Z309 Encounter for contraceptive management, unspecified: Secondary | ICD-10-CM | POA: Diagnosis not present

## 2017-09-16 LAB — POCT URINE PREGNANCY: Preg Test, Ur: NEGATIVE

## 2017-09-16 NOTE — Progress Notes (Signed)
Pt presents for annual and all STD testing. She c/o another possible pelvic infection. Pt has a "fishy" discharge with odor. Pt also c/o irregular cycles.

## 2017-09-16 NOTE — Patient Instructions (Addendum)
Place premenopausal annual exam patient instructions here.  °

## 2017-09-16 NOTE — Progress Notes (Signed)
  Subjective:     Lori Moses is a 21 y.o. female here for a routine exam and STD screening.  Current complaints: reports irregular menstrual cycles since May 2018.  Reports bleeding for 2-5 days, then stops. Flow is described as light.  Considering birth control options.  Does not want to do IUD/Nexplanon, may be open to ocps.  Currently at school at [employer].  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  Pregnancy not desired at this time.  With partner x 3 months.  Use condoms inconsistently.  +treatment for bacterial vaginosis multiple times.  All GC/CT has been negative.  Desires flu shot today.    Gynecologic History Patient's last menstrual period was 08/29/2017. Contraception: condoms Last Pap: none, less than 20 yo.  Last mammogram: Not needed.   Obstetric History OB History  Gravida Para Term Preterm AB Living  0 0 0 0 0 0  SAB TAB Ectopic Multiple Live Births  0 0 0 0           The following portions of the patient's history were reviewed and updated as appropriate: allergies, current medications, past family history, past medical history, past social history, past surgical history and problem list.  Review of Systems Pertinent items are noted in HPI.    Objective:  BP 118/78   Pulse 97   Temp 97.9 F (36.6 C)   Ht  (1.702 m)   Wt 147 lb 12.8 oz (67 kg)   LMP 08/29/2017   BMI 23.15 kg/m  General appearance: alert, cooperative and appears stated age Head: Normocephalic, without obvious abnormality, atraumatic Neck: no adenopathy, no carotid bruit, no JVD, supple, symmetrical, trachea midline and thyroid not enlarged, symmetric, no tenderness/mass/nodules Lungs: clear to auscultation bilaterally Breasts: normal appearance, no masses or tenderness, No nipple retraction or dimpling, No nipple discharge or bleeding, No axillary or supraclavicular adenopathy, Normal to palpation without dominant masses, Taught monthly breast self examination Heart: regular rate and rhythm, S1,  S2 normal, no murmur, click, rub or gallop Abdomen: soft, non-tender; bowel sounds normal; no masses,  no organomegaly Pelvic: cervix normal in appearance, external genitalia normal, no adnexal masses or tenderness, no cervical motion tenderness, rectovaginal septum normal, uterus normal size, shape, and consistency an+yellow/green vaginal discharge. Skin: Skin color, texture, turgor normal. No rashes or lesions       Assessment:    Annual Exam Vaginal Discharge Irregular Menses Family Planning Counseling       Plan:    Education reviewed: depression evaluation, safe sex/STD prevention, self breast exams and Family Planning.  Reviewed contraception effectiveness chart. Labs:  Wet prep; GC/CT  Plans to come to [facility] for blood draw (HIV, HepB/C, RPR) Depression screen at next visit Plans to follow-up in 3 weeks to review family planning options and make decision  Elenora Fender, Walidah N, CNM

## 2017-09-19 ENCOUNTER — Other Ambulatory Visit: Payer: Self-pay

## 2017-09-19 DIAGNOSIS — Z01419 Encounter for gynecological examination (general) (routine) without abnormal findings: Secondary | ICD-10-CM

## 2017-09-19 LAB — CERVICOVAGINAL ANCILLARY ONLY
BACTERIAL VAGINITIS: NEGATIVE
Candida vaginitis: NEGATIVE
Chlamydia: POSITIVE — AB
NEISSERIA GONORRHEA: NEGATIVE
Trichomonas: NEGATIVE

## 2017-09-20 ENCOUNTER — Other Ambulatory Visit: Payer: Self-pay | Admitting: Family

## 2017-09-20 ENCOUNTER — Telehealth: Payer: Self-pay | Admitting: Family

## 2017-09-20 DIAGNOSIS — A749 Chlamydial infection, unspecified: Secondary | ICD-10-CM

## 2017-09-20 LAB — HEPATITIS C ANTIBODY: Hep C Virus Ab: <0.1 s/co ratio (ref 0.0–0.9)

## 2017-09-20 LAB — RPR: RPR Ser Ql: NONREACTIVE

## 2017-09-20 LAB — HEPATITIS B SURFACE ANTIGEN: Hepatitis B Surface Ag: NEGATIVE

## 2017-09-20 LAB — HIV ANTIBODY (ROUTINE TESTING W REFLEX): HIV Screen 4th Generation wRfx: NONREACTIVE

## 2017-09-20 MED ORDER — AZITHROMYCIN 500 MG PO TABS: 1000.0000 mg | ORAL_TABLET | Freq: Once | ORAL | 0 refills | Status: AC

## 2017-09-21 ENCOUNTER — Encounter: Payer: Self-pay | Admitting: Family

## 2017-09-22 ENCOUNTER — Encounter: Payer: Self-pay | Admitting: Family

## 2017-09-26 ENCOUNTER — Other Ambulatory Visit: Payer: Self-pay | Admitting: Family

## 2017-09-26 ENCOUNTER — Encounter: Payer: Self-pay | Admitting: Family

## 2017-09-26 DIAGNOSIS — R102 Pelvic and perineal pain: Secondary | ICD-10-CM

## 2017-09-26 NOTE — Progress Notes (Signed)
Pt reports continued pain after taking medication.  Order placed for follow-up pelvic ultrasound.

## 2017-09-26 NOTE — Telephone Encounter (Signed)
Notes recorded by [name], Camauri Fleece N, CNM on 09/20/2017 at 10:54 AM EDT Patient called. Patient aware. Pt notified regarding positive chlamydia and RX sent to pharmacy. Informed partner will need to [facility] to [facility], [address], [place], [place] [phone] for treatment and that all sexual partners will also need to be treated. Pt verbalizes understanding and agrees with plan of care.

## 2017-10-04 ENCOUNTER — Ambulatory Visit (HOSPITAL_COMMUNITY): Payer: Medicaid Other

## 2017-10-07 ENCOUNTER — Ambulatory Visit (INDEPENDENT_AMBULATORY_CARE_PROVIDER_SITE_OTHER): Payer: Medicaid Other | Admitting: Family

## 2017-10-07 ENCOUNTER — Ambulatory Visit (HOSPITAL_COMMUNITY)
Admission: RE | Admit: 2017-10-07 | Discharge: 2017-10-07 | Disposition: A | Discharge status: Home / Self Care | Payer: Self-pay | Source: Ambulatory Visit | Attending: Family | Admitting: Family

## 2017-10-07 ENCOUNTER — Encounter: Payer: Self-pay | Admitting: Family

## 2017-10-07 VITALS — BP 132/77 | HR 95 | Wt 150.0 lb

## 2017-10-07 DIAGNOSIS — Z309 Encounter for contraceptive management, unspecified: Secondary | ICD-10-CM

## 2017-10-07 DIAGNOSIS — J302 Other seasonal allergic rhinitis: Secondary | ICD-10-CM

## 2017-10-07 DIAGNOSIS — R102 Pelvic and perineal pain: Secondary | ICD-10-CM | POA: Insufficient documentation

## 2017-10-07 DIAGNOSIS — Z3009 Encounter for other general counseling and advice on contraception: Secondary | ICD-10-CM

## 2017-10-07 MED ORDER — NORETHIN ACE-ETH ESTRAD-FE 1-20 MG-MCG(24) PO TABS: 1.0000 | ORAL_TABLET | Freq: Every day | ORAL | 11 refills | Status: DC

## 2017-10-07 MED ORDER — CETIRIZINE HCL 10 MG PO TABS: 10.0000 mg | ORAL_TABLET | Freq: Every day | ORAL | 2 refills | Status: DC

## 2017-10-07 NOTE — Progress Notes (Signed)
GYNECOLOGY OFFICE VISIT NOTE  History:  Lori Moses is  here today for birth control counseling. Continues to have "fishy" discharge and prefers additional medication for bacterial vaginosis.  Reports taking all meds for +chlamydia.  States not sexually active.  Past Medical History:  Diagnosis Date  . Medical history non-contributory   . PID (acute pelvic inflammatory disease) 05/2017    Past Surgical History:  Procedure Laterality Date  . HEART TUMOR EXCISION     as an infant     The following portions of the patient's history were reviewed and updated as appropriate: allergies, current medications, past family history, past medical history, past social history, past surgical history and problem list.   Health Maintenance:  Pap and Mammogram not due; Flu vaccine up to date.    Review of Systems:  Pertinent items noted in HPI and remainder of comprehensive ROS otherwise negative.  Objective:  Physical Exam Vitals:   10/07/17 1540  BP: 132/77  Pulse: 95    CONSTITUTIONAL: Well-developed, well-nourished female in no acute distress.  HENT:  Normocephalic, atraumatic. NECK: Normal range of motion, supple, no masses SKIN: Skin is warm and dry. No rash noted. Not diaphoretic. No erythema. No pallor. NEUROLOGIC: Alert and oriented to person, place, and time. Normal reflexes, muscle tone coordination.  CARDIOVASCULAR: Normal heart rate noted RESPIRATORY: Effort and breath sounds normal, no problems with respiration noted ABDOMEN: Soft, no distention noted.   MUSCULOSKELETAL: Normal range of motion. No edema noted.  Labs and Imaging Recent Results (from the past 2160 hour(s))  Urinalysis, Routine w reflex microscopic     Status: Abnormal   Collection Time: 07/25/17  5:15 PM  Result Value Ref Range   Color, Urine YELLOW YELLOW   APPearance CLOUDY (A) CLEAR   Specific Gravity, Urine 1.021 1.005 - 1.030   pH 6.5 5.0 - 8.0   Glucose, UA NEGATIVE NEGATIVE mg/dL   Hgb  urine dipstick NEGATIVE NEGATIVE   Bilirubin Urine NEGATIVE NEGATIVE   Ketones, ur NEGATIVE NEGATIVE mg/dL   Protein, ur NEGATIVE NEGATIVE mg/dL   Nitrite NEGATIVE NEGATIVE   Leukocytes, UA MODERATE (A) NEGATIVE  Pregnancy, urine     Status: None   Collection Time: 07/25/17  5:15 PM  Result Value Ref Range   Preg Test, Ur NEGATIVE NEGATIVE    Comment:        THE SENSITIVITY OF THIS METHODOLOGY IS >20 mIU/mL.   Urinalysis, Microscopic (reflex)     Status: Abnormal   Collection Time: 07/25/17  5:15 PM  Result Value Ref Range   RBC / HPF 0-5 0 - 5 RBC/hpf   WBC, UA 6-30 0 - 5 WBC/hpf   Bacteria, UA FEW (A) NONE SEEN   Squamous Epithelial / LPF 6-30 (A) NONE SEEN   Mucus PRESENT   Urine culture     Status: Abnormal   Collection Time: 07/25/17  5:15 PM  Result Value Ref Range   Specimen Description URINE, RANDOM    Special Requests NONE    Culture MULTIPLE SPECIES PRESENT, SUGGEST RECOLLECTION (A)    Report Status 07/27/2017 FINAL   Urinalysis, Routine w reflex microscopic     Status: Abnormal   Collection Time: 08/05/17 10:28 AM  Result Value Ref Range   Color, Urine STRAW (A) YELLOW   APPearance CLEAR CLEAR   Specific Gravity, Urine 1.015 1.005 - 1.030   pH 7.0 5.0 - 8.0   Glucose, UA NEGATIVE NEGATIVE mg/dL   Hgb urine dipstick NEGATIVE NEGATIVE  Bilirubin Urine NEGATIVE NEGATIVE   Ketones, ur NEGATIVE NEGATIVE mg/dL   Protein, ur NEGATIVE NEGATIVE mg/dL   Nitrite NEGATIVE NEGATIVE   Leukocytes, UA NEGATIVE NEGATIVE  Pregnancy, urine POC     Status: None   Collection Time: 08/05/17 10:49 AM  Result Value Ref Range   Preg Test, Ur NEGATIVE NEGATIVE    Comment:        THE SENSITIVITY OF THIS METHODOLOGY IS >24 mIU/mL   Cervicovaginal ancillary only     Status: Abnormal   Collection Time: 09/16/17 12:00 AM  Result Value Ref Range   Chlamydia **POSITIVE** (A)     Comment: Normal Reference Range - Negative   Neisseria gonorrhea Negative     Comment: Normal Reference  Range - Negative   Bacterial vaginitis Negative for Bacterial Vaginitis Microorganisms     Comment: Normal Reference Range - Negative   Candida vaginitis Negative for Candida species     Comment: Normal Reference Range - Negative   Trichomonas Negative     Comment: Normal Reference Range - Negative  POCT urine pregnancy     Status: None   Collection Time: 09/16/17  3:48 PM  Result Value Ref Range   Preg Test, Ur Negative Negative  RPR     Status: None   Collection Time: 09/19/17 10:21 AM  Result Value Ref Range   RPR Ser Ql Non Reactive Non Reactive  HIV antibody     Status: None   Collection Time: 09/19/17 10:21 AM  Result Value Ref Range   HIV Screen 4th Generation wRfx Non Reactive Non Reactive  Hepatitis B Surface AntiGEN     Status: None   Collection Time: 09/19/17 10:21 AM  Result Value Ref Range   Hepatitis B Surface Ag Negative Negative  Hepatitis C Antibody     Status: None   Collection Time: 09/19/17 10:21 AM  Result Value Ref Range   Hep C Virus Ab <0.1 0.0 - 0.9 s/co ratio    Comment:                                   Negative:     < 0.8                              Indeterminate: 0.8 - 0.9                                   Positive:     > 0.9  The CDC recommends that a positive HCV antibody result  be followed up with a HCV Nucleic Acid Amplification  test ([id number]).      Assessment & Plan:  Birth Control Counseling Abnormal Vaginal Odor - recurrent bacterial vaginosis  Routine preventative health maintenance measures emphasized. Reviewed ultrasound results > normal RX Lo-estrin FE 24; take one q day; use condoms x 2 weeks. RX Solesec > need prior auth; notify regarding ineffective treatment with flagyl  [name], Torryn Fiske N, CNM

## 2017-10-10 ENCOUNTER — Other Ambulatory Visit: Payer: Self-pay | Admitting: *Deleted

## 2017-10-15 ENCOUNTER — Encounter: Payer: Self-pay | Admitting: Family

## 2017-10-18 ENCOUNTER — Other Ambulatory Visit: Payer: Self-pay | Admitting: Family

## 2017-10-18 ENCOUNTER — Encounter: Payer: Self-pay | Admitting: Family

## 2017-10-18 MED ORDER — AZITHROMYCIN 500 MG PO TABS: 1000.0000 mg | ORAL_TABLET | Freq: Once | ORAL | 0 refills | Status: AC

## 2017-10-19 ENCOUNTER — Encounter: Payer: Self-pay | Admitting: Family

## 2017-10-28 ENCOUNTER — Encounter: Payer: Self-pay | Admitting: Family

## 2017-11-04 ENCOUNTER — Other Ambulatory Visit (HOSPITAL_COMMUNITY)
Admission: RE | Admit: 2017-11-04 | Discharge: 2017-11-04 | Disposition: A | Discharge status: Home / Self Care | Payer: Medicaid Other | Source: Ambulatory Visit | Attending: Student | Admitting: Student

## 2017-11-04 ENCOUNTER — Encounter: Payer: Self-pay | Admitting: Student

## 2017-11-04 ENCOUNTER — Ambulatory Visit (INDEPENDENT_AMBULATORY_CARE_PROVIDER_SITE_OTHER): Payer: Medicaid Other | Admitting: Student

## 2017-11-04 VITALS — BP 126/77 | HR 72 | Wt 151.0 lb

## 2017-11-04 DIAGNOSIS — A749 Chlamydial infection, unspecified: Secondary | ICD-10-CM | POA: Insufficient documentation

## 2017-11-04 DIAGNOSIS — N898 Other specified noninflammatory disorders of vagina: Secondary | ICD-10-CM | POA: Insufficient documentation

## 2017-11-04 DIAGNOSIS — R102 Pelvic and perineal pain: Secondary | ICD-10-CM | POA: Insufficient documentation

## 2017-11-04 NOTE — Progress Notes (Signed)
Subjective:     Lori ID: Lori Moses, female   DOB: Jun 19, 1996, 21 y.o.   MRN: [id number] Lori Moses is a 21 y.o. G0P0000 here with complaints of  unresolved pelvic pain, side pain and yellow malodorous discharge. Lori has been seen twice in this office for these symptoms in Oct and Nov.  She also states that she has had recurrent BV infections since 2016.   At October visit she had a positive chlamydia nd was treated twice with azithromycin after her symptoms did not go away.  She says that she has not had any STDs before the diagnosis in October. After the two doses of azithrymycin, Lori says that she felt better but now the pain and discharge are back. She says her partner was tested for chlamydia and tested negative.   At November visit Lori was prescribed Lo-estrin, which she took for a few weeks and then stopped because she says that symptoms were worse. She requested a RX for solesec; prior auth was pending.  Lori also had pelvic US on 10-07-2017, which was benign.   Lori was seen in the [facility] on May 17, 2017 for similar complaint (pelvic pain, discharge). At that time US showed right cyst, otherwise normal pelvic US.  She was treated for PID at that visit (doxy, rocephin, azithromycin); she says her pelvic pain, side pain and discharge went away after that. They returned in Oct, 2018, which is why she sought care at that time.   Pelvic Pain  The Lori's primary symptoms include pelvic pain and vaginal discharge. The current episode started more than 1 month ago. The problem occurs constantly. The problem has been unchanged. Associated symptoms include dysuria and joint pain. The vaginal discharge was yellow and malodorous. She is sexually active. No, her partner does not have an STD.   Lori says that she has side pain that radiates down her legs. The pain is in her side/hip. It is bilateral; it happens all the time but is worse at night. She also has some  chest pain that has occurred twice (once at her June visit, and once yesterday). She felt like she couldn't move or breathe; it lasted about a minute.   She also  Review of Systems  Respiratory: Positive for chest tightness.   Gastrointestinal: Negative.   Genitourinary: Positive for dysuria, pelvic pain and vaginal discharge.  Musculoskeletal: Positive for joint pain.  Neurological: Negative.   Hematological: Negative.   Psychiatric/Behavioral: Negative.        Objective:   Physical Exam  Constitutional: She is oriented to person, place, and time. She appears well-developed and well-nourished.  Neck: Normal range of motion.  Abdominal: Soft.  Genitourinary:  Genitourinary Comments: NEFG; scant blood in the vaginal vault. No odor; no discharge in the vagina, vaginal walls are pink. Cervix is pink with no lesions. No CMT, slight adnexal tenderness bilaterally; no suprapubic tenderness.   Musculoskeletal: Normal range of motion.  Neurological: She is alert and oriented to person, place, and time.  Skin: Skin is warm and dry.  Psychiatric: She has a normal mood and affect.       Assessment:     1. Chlamydia   2. Pelvic pain   3. Vaginal discharge        Plan:       1. Repeat cultures for GC, CT, bv, yeast and trich.  2. Urine Culture for UTI.  3. Consulted with Dr. Debroah LoopArnold, who does not recommend repeat [place]  at this point, given that US on 11-2 was normal and bimanuel exam was negative for severe pain or masses.  4. Appt made for [facility] in order to discuss side and leg pain; recommended that Lori increase exercise, stretching and yoga.  5. Lori also plans to make appointment for partner for testing.  6. Checked with Cheree DittoGraham, CMA, who says that Lori's medicaid will not cover Solesec Cheree Ditto(Graham had extensive conversation with the rep who said that it is not covered).  7. Lori knows we will call her with results of tests and plan of care.   Luna KitchensKathryn Kooistra

## 2017-11-04 NOTE — Patient Instructions (Signed)

## 2017-11-07 LAB — CERVICOVAGINAL ANCILLARY ONLY
Bacterial vaginitis: NEGATIVE
CANDIDA VAGINITIS: POSITIVE — AB
Chlamydia: NEGATIVE
Neisseria Gonorrhea: NEGATIVE
TRICH (WINDOWPATH): NEGATIVE

## 2017-11-09 ENCOUNTER — Other Ambulatory Visit: Payer: Self-pay | Admitting: Student

## 2017-11-09 MED ORDER — FLUCONAZOLE 150 MG PO TABS: 150.0000 mg | ORAL_TABLET | Freq: Every day | ORAL | 1 refills | Status: DC

## 2017-11-22 ENCOUNTER — Ambulatory Visit (INDEPENDENT_AMBULATORY_CARE_PROVIDER_SITE_OTHER): Payer: Self-pay | Admitting: Physician Assistant

## 2017-11-22 ENCOUNTER — Encounter (INDEPENDENT_AMBULATORY_CARE_PROVIDER_SITE_OTHER): Payer: Self-pay | Admitting: Physician Assistant

## 2017-11-22 ENCOUNTER — Other Ambulatory Visit: Payer: Self-pay

## 2017-11-22 VITALS — BP 105/73 | HR 86 | Temp 98.4°F | Ht 67.5 in | Wt 151.2 lb

## 2017-11-22 DIAGNOSIS — Z131 Encounter for screening for diabetes mellitus: Secondary | ICD-10-CM

## 2017-11-22 DIAGNOSIS — B373 Candidiasis of vulva and vagina: Secondary | ICD-10-CM

## 2017-11-22 DIAGNOSIS — M25551 Pain in right hip: Secondary | ICD-10-CM

## 2017-11-22 DIAGNOSIS — R102 Pelvic and perineal pain unspecified side: Secondary | ICD-10-CM

## 2017-11-22 DIAGNOSIS — M25552 Pain in left hip: Secondary | ICD-10-CM

## 2017-11-22 DIAGNOSIS — B3731 Acute candidiasis of vulva and vagina: Secondary | ICD-10-CM

## 2017-11-22 LAB — POCT URINALYSIS DIPSTICK
BILIRUBIN UA: NEGATIVE
Blood, UA: NEGATIVE
GLUCOSE UA: NEGATIVE
KETONES UA: NEGATIVE
Nitrite, UA: NEGATIVE
PH UA: 6 (ref 5.0–8.0)
Protein, UA: NEGATIVE
Spec Grav, UA: 1.025 (ref 1.010–1.025)
UROBILINOGEN UA: 0.2 U/dL

## 2017-11-22 LAB — GLUCOSE, POCT (MANUAL RESULT ENTRY): POC GLUCOSE: 93 mg/dL (ref 70–99)

## 2017-11-22 LAB — POCT URINE PREGNANCY: Preg Test, Ur: NEGATIVE

## 2017-11-22 MED ORDER — NAPROXEN 500 MG PO TABS: 500.0000 mg | ORAL_TABLET | Freq: Two times a day (BID) | ORAL | 0 refills | Status: DC

## 2017-11-22 MED ORDER — FLUCONAZOLE 150 MG PO TABS: 150.0000 mg | ORAL_TABLET | ORAL | 0 refills | Status: DC

## 2017-11-22 NOTE — Progress Notes (Signed)
Subjective:  Patient ID: Lori Moses, female    DOB: 1996/03/11  Age: 21 y.o. MRN: [id number]  CC: pelvic pain  HPI Lori Moses is a 21 y.o. female with a medical history of PID presents as a new patient with complaint of pelvic pain. Pelvic pain onset was six months ago. Has been assessed by GYN several times and has been treated for PID. Stopped taking birth control pills as her symptoms became worse with OCP.  Pain 10/10 in the bilateral hips. Says pain starts in the inguinals radiates upward to the ASIS and then radiates downward the whole length of the anterior leg. Remains sexually active, "frequently". Does not report any other symptoms or complaints.      Outpatient Medications Prior to Visit  Medication Sig Dispense Refill  . cetirizine (ZYRTEC) 10 MG tablet Take 1 tablet (10 mg total) by mouth daily. (Patient not taking: Reported on 11/22/2017) 30 tablet 2  . Norethindrone Acetate-Ethinyl Estrad-FE (LOESTRIN 24 FE) 1-20 MG-MCG(24) tablet Take 1 tablet by mouth daily. (Patient not taking: Reported on 11/04/2017) 1 Package 11  . omeprazole (PRILOSEC) 40 MG capsule Take 1 capsule (40 mg total) by mouth 2 (two) times daily. 28 capsule 0  . fluconazole (DIFLUCAN) 150 MG tablet Take 1 tablet (150 mg total) by mouth daily. 1 tablet 1   No facility-administered medications prior to visit.      ROS Review of Systems  Constitutional: Negative for chills, fever and malaise/fatigue.  Eyes: Negative for blurred vision.  Respiratory: Negative for shortness of breath.   Cardiovascular: Negative for chest pain and palpitations.  Gastrointestinal: Negative for abdominal pain and nausea.  Genitourinary: Negative for dysuria and hematuria.  Musculoskeletal: Negative for joint pain and myalgias.  Skin: Negative for rash.  Neurological: Negative for tingling and headaches.  Psychiatric/Behavioral: Negative for depression. The patient is not nervous/anxious.     Objective:  BP  105/73 (BP Location: Right Arm, Patient Position: Sitting, Cuff Size: Normal)   Pulse 86   Temp 98.4 F (36.9 C) (Oral)   Ht 5' 7.5" (1.715 m)   Wt 151 lb 3.2 oz (68.6 kg)   LMP 11/04/2017   SpO2 96%   BMI 23.33 kg/m   BP/Weight 11/22/2017 11/04/2017 10/07/2017  Systolic BP 105 126 132  Diastolic BP 73 77 77  Wt. (Lbs) 151.2 151 150  BMI 23.33 23.65 23.49      Physical Exam  Constitutional: She is oriented to person, place, and time.  Well developed, well nourished, NAD, polite  HENT:  Head: Normocephalic and atraumatic.  Eyes: No scleral icterus.  Neck: Normal range of motion. Neck supple. No thyromegaly present.  Cardiovascular: Normal rate, regular rhythm and normal heart sounds.  Pulmonary/Chest: Effort normal and breath sounds normal.  Abdominal: Soft. Bowel sounds are normal. There is no tenderness.  Genitourinary:  Genitourinary Comments: Thick, white, curdy vaginal discharge. Cervix erythematous with no motion tenderness. No adnexal mass or tenderness bilaterally. No uterine mass or tenderness.   Musculoskeletal: She exhibits no edema.  Moderate TTP over the greater trochanter of femur bilaterally.  Neurological: She is alert and oriented to person, place, and time. No cranial nerve deficit. Coordination normal.  Gait normal  Skin: Skin is warm and dry. No rash noted. No erythema. No pallor.  Psychiatric: She has a normal mood and affect. Her behavior is normal. Thought content normal.  Vitals reviewed.    Assessment & Plan:   1. Hip pain, bilateral - Presentation is  atypical. Suspecting trochanteric bursitis.  - XR HIPS BILAT W OR W/O PELVIS 3-4 VIEWS - Begin naproxen (NAPROSYN) 500 MG tablet; Take 1 tablet (500 mg total) by mouth 2 (two) times daily with a meal.  Dispense: 30 tablet; Refill: 0  2. Pelvic pain in female - Urinalysis Dipstick with small leukocytes in clinic today. - POCT urine pregnancy negative in clinic today. - Begin naproxen (NAPROSYN)  500 MG tablet; Take 1 tablet (500 mg total) by mouth 2 (two) times daily with a meal.  Dispense: 30 tablet; Refill: 0  3. Vaginal candidiasis - Begin fluconazole (DIFLUCAN) 150 MG tablet; Take 1 tablet (150 mg total) by mouth every 3 (three) days.  Dispense: 2 tablet; Refill: 0  4. Screening for diabetes mellitus - Glucose (CBG) 93 in clinic today.   Meds ordered this encounter  Medications  . fluconazole (DIFLUCAN) 150 MG tablet    Sig: Take 1 tablet (150 mg total) by mouth every 3 (three) days.    Dispense:  2 tablet    Refill:  0    Order Specific Question:   Supervising Provider    Answer:   Quentin AngstJEGEDE, OLUGBEMIGA E [id number]  . naproxen (NAPROSYN) 500 MG tablet    Sig: Take 1 tablet (500 mg total) by mouth 2 (two) times daily with a meal.    Dispense:  30 tablet    Refill:  0    Order Specific Question:   Supervising Provider    Answer:   Quentin AngstJEGEDE, OLUGBEMIGA E [id number]    Follow-up: Return if symptoms worsen or fail to improve.   Loletta Specteroger David Terry Bolotin PA

## 2017-11-22 NOTE — Patient Instructions (Addendum)
Bursitis Bursitis is inflammation and irritation of a bursa, which is one of the small, fluid-filled sacs that cushion and protect the moving parts of your body. These sacs are located between bones and muscles, muscle attachments, or skin areas next to bones. A bursa protects these structures from the wear and tear that results from frequent movement. An inflamed bursa causes pain and swelling. Fluid may build up inside the sac. Bursitis is most common near joints, especially the knees, elbows, hips, and shoulders. What are the causes? Bursitis can be caused by:  Injury from: ? A direct blow, like falling on your knee or elbow. ? Overuse of a joint (repetitive stress).  Infection. This can happen if bacteria gets into a bursa through a cut or scrape near a joint.  Diseases that cause joint inflammation, such as gout and rheumatoid arthritis.  What increases the risk? You may be at risk for bursitis if you:  Have a job or hobby that involves a lot of repetitive stress on your joints.  Have a condition that weakens your body's defense system (immune system), such as diabetes, cancer, or HIV.  Lift and reach overhead often.  Kneel or lean on hard surfaces often.  Run or walk often.  What are the signs or symptoms? The most common signs and symptoms of bursitis are:  Pain that gets worse when you move the affected body part or put weight on it.  Inflammation.  Stiffness.  Other signs and symptoms may include:  Redness.  Tenderness.  Warmth.  Pain that continues after rest.  Fever and chills. This may occur in bursitis caused by infection.  How is this diagnosed? Bursitis may be diagnosed by:  Medical history and physical exam.  MRI.  A procedure to drain fluid from the bursa with a needle (aspiration). The fluid may be checked for signs of infection or gout.  Blood tests to rule out other causes of inflammation.  How is this treated? Bursitis can usually be  treated at home with rest, ice, compression, and elevation (RICE). For mild bursitis, RICE treatment may be all you need. Other treatments may include:  Nonsteroidal anti-inflammatory drugs (NSAIDs) to treat pain and inflammation.  Corticosteroids to fight inflammation. You may have these drugs injected into and around the area of bursitis.  Aspiration of bursitis fluid to relieve pain and improve movement.  Antibiotic medicine to treat an infected bursa.  A splint, brace, or walking aid.  Physical therapy if you continue to have pain or limited movement.  Surgery to remove a damaged or infected bursa. This may be needed if you have a very bad case of bursitis or if other treatments have not worked.  Follow these instructions at home:  Take medicines only as directed by your health care provider.  If you were prescribed an antibiotic medicine, finish it all even if you start to feel better.  Rest the affected area as directed by your health care provider. ? Keep the area elevated. ? Avoid activities that make pain worse.  Apply ice to the injured area: ? Place ice in a plastic bag. ? Place a towel between your skin and the bag. ? Leave the ice on for 20 minutes, 2-3 times a day.  Use splints, braces, pads, or walking aids as directed by your health care provider.  Keep all follow-up visits as directed by your health care provider. This is important. How is this prevented?  Wear knee pads if you kneel often.    Wear sturdy running or walking shoes that fit you well.  Take regular breaks from repetitive activity.  Warm up by stretching before doing any strenuous activity.  Maintain a healthy weight or lose weight as recommended by your health care provider. Ask your health care provider if you need help.  Exercise regularly. Start any new physical activity gradually. Contact a health care provider if:  Your bursitis is not responding to treatment or home care.  You have  a fever.  You have chills. This information is not intended to replace advice given to you by your health care provider. Make sure you discuss any questions you have with your health care provider. Document Released: 11/19/2000 Document Revised: 04/29/2016 Document Reviewed: 02/11/2014 Elsevier Interactive Patient Education  2018 [employer]  Vaginal Yeast infection, Adult Vaginal yeast infection is a condition that causes soreness, swelling, and redness (inflammation) of the vagina. It also causes vaginal discharge. This is a common condition. Some women get this infection frequently. What are the causes? This condition is caused by a change in the normal balance of the yeast (candida) and bacteria that live in the vagina. This change causes an overgrowth of yeast, which causes the inflammation. What increases the risk? This condition is more likely to develop in:  Women who take antibiotic medicines.  Women who have diabetes.  Women who take birth control pills.  Women who are pregnant.  Women who douche often.  Women who have a weak defense (immune) system.  Women who have been taking steroid medicines for a long time.  Women who frequently wear tight clothing.  What are the signs or symptoms? Symptoms of this condition include:  White, thick vaginal discharge.  Swelling, itching, redness, and irritation of the vagina. The lips of the vagina (vulva) may be affected as well.  Pain or a burning feeling while urinating.  Pain during sex.  How is this diagnosed? This condition is diagnosed with a medical history and physical exam. This will include a pelvic exam. Your health care provider will examine a sample of your vaginal discharge under a microscope. Your health care provider may send this sample for testing to confirm the diagnosis. How is this treated? This condition is treated with medicine. Medicines may be over-the-counter or prescription. You may be told to  use one or more of the following:  Medicine that is taken orally.  Medicine that is applied as a cream.  Medicine that is inserted directly into the vagina (suppository).  Follow these instructions at home:  Take or apply over-the-counter and prescription medicines only as told by your health care provider.  Do not have sex until your health care provider has approved. Tell your sex partner that you have a yeast infection. That person should go to his or her health care provider if he or she develops symptoms.  Do not wear tight clothes, such as pantyhose or tight pants.  Avoid using tampons until your health care provider approves.  Eat more yogurt. This may help to keep your yeast infection from returning.  Try taking a sitz bath to help with discomfort. This is a warm water bath that is taken while you are sitting down. The water should only come up to your hips and should cover your buttocks. Do this 3-4 times per day or as told by your health care provider.  Do not douche.  Wear breathable, cotton underwear.  If you have diabetes, keep your blood sugar levels under control. Contact a  health care provider if:  You have a fever.  Your symptoms go away and then return.  Your symptoms do not get better with treatment.  Your symptoms get worse.  You have new symptoms.  You develop blisters in or around your vagina.  You have blood coming from your vagina and it is not your menstrual period.  You develop pain in your abdomen. This information is not intended to replace advice given to you by your health care provider. Make sure you discuss any questions you have with your health care provider. Document Released: 09/01/2005 Document Revised: 05/05/2016 Document Reviewed: 05/26/2015 Elsevier Interactive Patient Education  2018 [employer]

## 2017-12-02 ENCOUNTER — Encounter (INDEPENDENT_AMBULATORY_CARE_PROVIDER_SITE_OTHER): Payer: Self-pay | Admitting: Physician Assistant

## 2017-12-03 ENCOUNTER — Encounter: Payer: Self-pay | Admitting: Family

## 2017-12-12 ENCOUNTER — Encounter: Payer: Self-pay | Admitting: *Deleted

## 2017-12-12 ENCOUNTER — Encounter: Payer: Self-pay | Admitting: Obstetrics

## 2017-12-12 ENCOUNTER — Other Ambulatory Visit (HOSPITAL_COMMUNITY)
Admission: RE | Admit: 2017-12-12 | Discharge: 2017-12-12 | Disposition: A | Discharge status: Home / Self Care | Payer: Medicaid Other | Source: Ambulatory Visit | Attending: Obstetrics | Admitting: Obstetrics

## 2017-12-12 ENCOUNTER — Ambulatory Visit (INDEPENDENT_AMBULATORY_CARE_PROVIDER_SITE_OTHER): Payer: Medicaid Other | Admitting: Obstetrics

## 2017-12-12 VITALS — BP 118/75 | HR 74 | Ht 67.0 in | Wt 147.6 lb

## 2017-12-12 DIAGNOSIS — Z3009 Encounter for other general counseling and advice on contraception: Secondary | ICD-10-CM

## 2017-12-12 DIAGNOSIS — Z113 Encounter for screening for infections with a predominantly sexual mode of transmission: Secondary | ICD-10-CM

## 2017-12-12 DIAGNOSIS — G8929 Other chronic pain: Secondary | ICD-10-CM

## 2017-12-12 DIAGNOSIS — M5441 Lumbago with sciatica, right side: Secondary | ICD-10-CM

## 2017-12-12 DIAGNOSIS — Z309 Encounter for contraceptive management, unspecified: Secondary | ICD-10-CM | POA: Diagnosis not present

## 2017-12-12 DIAGNOSIS — K219 Gastro-esophageal reflux disease without esophagitis: Secondary | ICD-10-CM

## 2017-12-12 DIAGNOSIS — Z01419 Encounter for gynecological examination (general) (routine) without abnormal findings: Secondary | ICD-10-CM

## 2017-12-12 DIAGNOSIS — N898 Other specified noninflammatory disorders of vagina: Secondary | ICD-10-CM

## 2017-12-12 DIAGNOSIS — R102 Pelvic and perineal pain: Secondary | ICD-10-CM

## 2017-12-12 DIAGNOSIS — B9689 Other specified bacterial agents as the cause of diseases classified elsewhere: Secondary | ICD-10-CM

## 2017-12-12 DIAGNOSIS — M5442 Lumbago with sciatica, left side: Secondary | ICD-10-CM

## 2017-12-12 DIAGNOSIS — N760 Acute vaginitis: Secondary | ICD-10-CM

## 2017-12-12 MED ORDER — SECNIDAZOLE 2 G PO PACK: 1.0000 | PACK | Freq: Once | ORAL | 2 refills | Status: DC

## 2017-12-12 MED ORDER — CYCLOBENZAPRINE HCL 10 MG PO TABS: 10.0000 mg | ORAL_TABLET | Freq: Three times a day (TID) | ORAL | 1 refills | Status: DC | PRN

## 2017-12-12 MED ORDER — IBUPROFEN 800 MG PO TABS: 800.0000 mg | ORAL_TABLET | Freq: Three times a day (TID) | ORAL | 5 refills | Status: DC | PRN

## 2017-12-12 NOTE — Progress Notes (Signed)
Patient is in the office for annual, pt complains of vaginal discharge and odor.

## 2017-12-12 NOTE — Progress Notes (Signed)
Subjective:        Lori Moses is a 22 y.o. female here for a routine exam.  Current complaints: Malodorous vaginal discharge.Pelvic pain that radiates down thighs from hips, and sometimes pain radiates from back.  Also has heartburn, unrelieved by meds.  Personal health questionnaire:  Is patient Ashkenazi Jewish, have a family history of breast and/or ovarian cancer: no Is there a family history of uterine cancer diagnosed at age < 8050, gastrointestinal cancer, urinary tract cancer, family member who is a Personnel officerLynch syndrome-associated carrier: no Is the patient overweight and hypertensive, family history of diabetes, personal history of gestational diabetes, preeclampsia or PCOS: no Is patient over [address], have PCOS,  family history of premature CHD under age 20, diabetes, smoke, have hypertension or peripheral artery disease:  no At any time, has a partner hit, kicked or otherwise hurt or frightened you?: no Over the past 2 weeks, have you felt down, depressed or hopeless?: no Over the past 2 weeks, have you felt little interest or pleasure in doing things?:no   Gynecologic History Patient's last menstrual period was 12/01/2017. Contraception: none Last Pap: n/a. Results were: n/a Last mammogram: n/a. Results were: n/a  Obstetric History OB History  Gravida Para Term Preterm AB Living  0 0 0 0 0 0  SAB TAB Ectopic Multiple Live Births  0 0 0 0          Past Medical History:  Diagnosis Date  . Medical history non-contributory   . PID (acute pelvic inflammatory disease) 05/2017    Past Surgical History:  Procedure Laterality Date  . HEART TUMOR EXCISION     as an infant     Current Outpatient Medications:  .  fluconazole (DIFLUCAN) 150 MG tablet, Take 1 tablet (150 mg total) by mouth every 3 (three) days. (Patient not taking: Reported on 12/12/2017), Disp: 2 tablet, Rfl: 0 .  naproxen (NAPROSYN) 500 MG tablet, Take 1 tablet (500 mg total) by mouth 2 (two) times daily with  a meal. (Patient not taking: Reported on 12/12/2017), Disp: 30 tablet, Rfl: 0 .  Norethindrone Acetate-Ethinyl Estrad-FE (LOESTRIN 24 FE) 1-20 MG-MCG(24) tablet, Take 1 tablet by mouth daily. (Patient not taking: Reported on 11/04/2017), Disp: 1 Package, Rfl: 11 .  omeprazole (PRILOSEC) 40 MG capsule, Take 1 capsule (40 mg total) by mouth 2 (two) times daily., Disp: 28 capsule, Rfl: 0 Allergies  Allergen Reactions  . Other Hives    potatoes  . Penicillins Hives    Has patient had a PCN reaction causing immediate rash, facial/tongue/throat swelling, SOB or lightheadedness with hypotension: Yes Has patient had a PCN reaction causing severe rash involving mucus membranes or skin necrosis: No Has patient had a PCN reaction that required hospitalization No Has patient had a PCN reaction occurring within the last 10 years: No If all of the above answers are "NO", then may proceed with Cephalosporin use.     Social History   Tobacco Use  . Smoking status: Never Smoker  . Smokeless tobacco: Never Used  Substance Use Topics  . Alcohol use: No    Family History  Problem Relation Age of Onset  . Arthritis Mother   . Arthritis Maternal Grandmother   . Breast cancer Paternal Grandmother   . Down syndrome Sister       Review of Systems  Constitutional: negative for fatigue and weight loss Respiratory: negative for cough and wheezing Cardiovascular: negative for chest pain, fatigue and palpitations Gastrointestinal: negative for abdominal  pain and change in bowel habits Musculoskeletal:positive for myalgias - backache with radiation down both legs Neurological: negative for gait problems and tremors Behavioral/Psych: negative for abusive relationship, depression Endocrine: negative for temperature intolerance    Genitourinary:positive for malodorous vaginal discharge and pelvic pain Integument/breast: negative for breast lump, breast tenderness, nipple discharge and skin lesion(s)     Objective:       BP 118/75   Pulse 74   Ht 5\' 7"  (1.702 m)   Wt 147 lb 9.6 oz (67 kg)   LMP 12/01/2017   BMI 23.12 kg/m  General:   alert  Skin:   no rash or abnormalities  Lungs:   clear to auscultation bilaterally  Heart:   regular rate and rhythm, S1, S2 normal, no murmur, click, rub or gallop  Breasts:   normal without suspicious masses, skin or nipple changes or axillary nodes  Abdomen:  normal findings: no organomegaly, soft, non-tender and no hernia  Pelvis:  External genitalia: normal general appearance Urinary system: urethral meatus normal and bladder without fullness, nontender Vaginal: normal without tenderness, induration or masses Cervix: normal appearance Adnexa: normal bimanual exam Uterus: anteverted and non-tender, normal size   Lab Review Urine pregnancy test Labs reviewed yes Radiologic studies reviewed no  50% of 20 min visit spent on counseling and coordination of care.   Assessment and Plan:     1. Encounter for gynecological examination  2. Vaginal discharge Rx: - Cervicovaginal ancillary only - Secnidazole (SOLOSEC) 2 g PACK; Take 1 packet by mouth once for 1 dose. Mix as directed with yogurt, pudding, applesauce, etc.  Dispense: 1 each; Refill: 2  3. Screening for STD (sexually transmitted disease)  4. Birth control counseling - considering options - using condoms for noe  5. Pelvic pain Rx: - Ibuprofen Rx  6. 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  Dispense: 30 tablet; Refill: 1 - ibuprofen (ADVIL,MOTRIN) 800 MG tablet; Take 1 tablet (800 mg total) by mouth every 8 (eight) hours as needed.  Dispense: 30 tablet; Refill: 5   Plan:    Education reviewed: calcium supplements, depression evaluation, low fat, low cholesterol diet, safe  sex/STD prevention, self breast exams and weight bearing exercise. Contraception: none and considering options. Follow up in: 1 year.   No orders of the defined types were placed in this encounter.  No orders of the defined types were placed in this encounter.

## 2017-12-13 LAB — CERVICOVAGINAL ANCILLARY ONLY
Bacterial vaginitis: NEGATIVE
Chlamydia: NEGATIVE
NEISSERIA GONORRHEA: NEGATIVE
TRICH (WINDOWPATH): NEGATIVE

## 2017-12-13 MED ORDER — METRONIDAZOLE 500 MG PO TABS: 500.0000 mg | ORAL_TABLET | Freq: Two times a day (BID) | ORAL | 2 refills | Status: DC

## 2017-12-13 NOTE — Addendum Note (Signed)
Addended by: Coral CeoHARPER, Maisyn Nouri A on: 12/13/2017 04:28 PM   Modules accepted: Orders

## 2018-01-10 ENCOUNTER — Ambulatory Visit: Payer: Medicaid Other | Admitting: Obstetrics

## 2018-01-11 ENCOUNTER — Other Ambulatory Visit: Payer: Self-pay | Admitting: Obstetrics

## 2018-01-11 DIAGNOSIS — B9689 Other specified bacterial agents as the cause of diseases classified elsewhere: Secondary | ICD-10-CM

## 2018-01-11 DIAGNOSIS — N760 Acute vaginitis: Principal | ICD-10-CM

## 2018-01-11 MED ORDER — CLINDAMYCIN HCL 300 MG PO CAPS: 300.0000 mg | ORAL_CAPSULE | Freq: Three times a day (TID) | ORAL | 1 refills | Status: DC

## 2018-01-30 ENCOUNTER — Ambulatory Visit: Payer: Medicaid Other

## 2018-02-03 ENCOUNTER — Encounter: Payer: Self-pay | Admitting: Obstetrics

## 2018-02-10 ENCOUNTER — Other Ambulatory Visit (HOSPITAL_COMMUNITY)
Admission: RE | Admit: 2018-02-10 | Discharge: 2018-02-10 | Disposition: A | Discharge status: Home / Self Care | Payer: Medicaid Other | Source: Ambulatory Visit | Attending: Family | Admitting: Family

## 2018-02-10 ENCOUNTER — Encounter: Payer: Self-pay | Admitting: Family

## 2018-02-10 ENCOUNTER — Ambulatory Visit (INDEPENDENT_AMBULATORY_CARE_PROVIDER_SITE_OTHER): Payer: Self-pay | Admitting: Family

## 2018-02-10 VITALS — BP 125/82 | HR 75 | Temp 97.7°F | Resp 16 | Wt 158.0 lb

## 2018-02-10 DIAGNOSIS — N898 Other specified noninflammatory disorders of vagina: Secondary | ICD-10-CM | POA: Insufficient documentation

## 2018-02-10 DIAGNOSIS — R350 Frequency of micturition: Secondary | ICD-10-CM

## 2018-02-10 LAB — POCT URINALYSIS DIP (MANUAL ENTRY)
BILIRUBIN UA: NEGATIVE mg/dL
Bilirubin, UA: NEGATIVE
Glucose, UA: NEGATIVE mg/dL
Leukocytes, UA: NEGATIVE
Nitrite, UA: NEGATIVE
PH UA: 7 (ref 5.0–8.0)
PROTEIN UA: NEGATIVE mg/dL
RBC UA: NEGATIVE
Spec Grav, UA: 1.01 (ref 1.010–1.025)
Urobilinogen, UA: 0.2 E.U./dL

## 2018-02-10 MED ORDER — OMEPRAZOLE 40 MG PO CPDR: 40.0000 mg | DELAYED_RELEASE_CAPSULE | Freq: Two times a day (BID) | ORAL | 0 refills | Status: DC

## 2018-02-10 MED ORDER — NITROFURANTOIN MONOHYD MACRO 100 MG PO CAPS: 100.0000 mg | ORAL_CAPSULE | Freq: Two times a day (BID) | ORAL | 0 refills | Status: DC

## 2018-02-11 ENCOUNTER — Encounter: Payer: Self-pay | Admitting: Family

## 2018-02-11 NOTE — Progress Notes (Signed)
Subjective:     Lori Moses is a 22 y.o. female who presents for evaluation of reoccurring pelvic pain and vaginal discharge. The pain is described as sharp.   Pain is located in the lower pelvis area without radiation. Onset was ongoing occurring several months ago. Symptoms have been unchanged since. Associated symptoms: greenish vaginal discharge and odor, in addition to urinary frequency. The patient denies chills, fever, nausea and vomiting. Risk factors for pelvic/abdominal pain include prior infection.  Menstrual History: OB History    Gravida Para Term Preterm AB Living   0 0 0 0 0 0   SAB TAB Ectopic Multiple Live Births   0 0 0 0         Patient's last menstrual period was 02/02/2018 (exact date).    The following portions of the patient's history were reviewed and updated as appropriate: allergies, current medications, past family history, past medical history, past social history, past surgical history and problem list.   Review of Systems Pertinent items are noted in HPI.    Objective:    BP 125/82 (BP Location: Right Arm, Patient Position: Sitting, Cuff Size: Large)   Pulse 75   Temp 97.7 F (36.5 C) (Oral)   Resp 16   Wt 158 lb (71.7 kg)   LMP 02/02/2018 (Exact Date)   SpO2 96%   BMI 24.75 kg/m  General:   alert, cooperative and appears stated age  Abdomen:  normal findings: soft, non-tender  CVA:   absent  Pelvis:  Vulva and vagina appear normal. Bimanual exam reveals normal uterus and adnexa.  Extremities:   extremities normal, atraumatic, no cyanosis or edema  Neurologic:   negative  Psychiatric:   normal mood, behavior, speech, dress, and thought processes   Lab Review Labs: pending; wet prep and GC/CT neg January 2019 Results for orders placed or performed in visit on 02/10/18 (from the past 24 hour(s))  POCT urinalysis dipstick     Status: Abnormal   Collection Time: 02/10/18  4:46 PM  Result Value Ref Range   Color, UA light yellow (A) yellow    Clarity, UA clear clear   Glucose, UA negative negative mg/dL   Bilirubin, UA negative negative   Ketones, POC UA negative negative mg/dL   Spec Grav, UA [id number] - 1.025   Blood, UA negative negative   pH, UA 7.0 5.0 - 8.0   Protein Ur, POC negative negative mg/dL   Urobilinogen, UA 0.2 0.2 or 1.0 E.U./dL   Nitrite, UA Negative Negative   Leukocytes, UA Negative Negative     Imaging Normal pelvic ultrasound 10/2017     Assessment:    Vaginal Discharge   Urinary Frequency Plan:    GC/CT pending   Wet prep pending RX Macrobid 100 mg BID x 3 days  [name], Walidah N, CNM

## 2018-02-12 LAB — URINE CULTURE

## 2018-02-13 LAB — CERVICOVAGINAL ANCILLARY ONLY
BACTERIAL VAGINITIS: NEGATIVE
Chlamydia: NEGATIVE
Neisseria Gonorrhea: NEGATIVE
TRICH (WINDOWPATH): NEGATIVE

## 2018-02-14 ENCOUNTER — Encounter (HOSPITAL_COMMUNITY): Payer: Self-pay | Admitting: Emergency Medicine

## 2018-02-14 ENCOUNTER — Ambulatory Visit (HOSPITAL_COMMUNITY)
Admission: EM | Admit: 2018-02-14 | Discharge: 2018-02-14 | Disposition: A | Discharge status: Home / Self Care | Payer: Self-pay | Attending: Family Medicine | Admitting: Family Medicine

## 2018-02-14 DIAGNOSIS — Z79899 Other long term (current) drug therapy: Secondary | ICD-10-CM | POA: Insufficient documentation

## 2018-02-14 DIAGNOSIS — G8929 Other chronic pain: Secondary | ICD-10-CM | POA: Insufficient documentation

## 2018-02-14 DIAGNOSIS — K5900 Constipation, unspecified: Secondary | ICD-10-CM | POA: Insufficient documentation

## 2018-02-14 DIAGNOSIS — Z803 Family history of malignant neoplasm of breast: Secondary | ICD-10-CM | POA: Insufficient documentation

## 2018-02-14 DIAGNOSIS — M545 Low back pain: Secondary | ICD-10-CM | POA: Insufficient documentation

## 2018-02-14 DIAGNOSIS — Z791 Long term (current) use of non-steroidal anti-inflammatories (NSAID): Secondary | ICD-10-CM | POA: Insufficient documentation

## 2018-02-14 DIAGNOSIS — Z9889 Other specified postprocedural states: Secondary | ICD-10-CM | POA: Insufficient documentation

## 2018-02-14 DIAGNOSIS — Z880 Allergy status to penicillin: Secondary | ICD-10-CM | POA: Insufficient documentation

## 2018-02-14 DIAGNOSIS — Z881 Allergy status to other antibiotic agents status: Secondary | ICD-10-CM | POA: Insufficient documentation

## 2018-02-14 DIAGNOSIS — Z8261 Family history of arthritis: Secondary | ICD-10-CM | POA: Insufficient documentation

## 2018-02-14 DIAGNOSIS — R350 Frequency of micturition: Secondary | ICD-10-CM | POA: Insufficient documentation

## 2018-02-14 DIAGNOSIS — R1032 Left lower quadrant pain: Secondary | ICD-10-CM | POA: Insufficient documentation

## 2018-02-14 DIAGNOSIS — Z810 Family history of intellectual disabilities: Secondary | ICD-10-CM | POA: Insufficient documentation

## 2018-02-14 DIAGNOSIS — M25552 Pain in left hip: Secondary | ICD-10-CM | POA: Insufficient documentation

## 2018-02-14 LAB — POCT URINALYSIS DIP (DEVICE)
BILIRUBIN URINE: NEGATIVE
Glucose, UA: NEGATIVE mg/dL
HGB URINE DIPSTICK: NEGATIVE
LEUKOCYTES UA: NEGATIVE
Nitrite: NEGATIVE
Protein, ur: NEGATIVE mg/dL
SPECIFIC GRAVITY, URINE: 1.025 (ref 1.005–1.030)
Urobilinogen, UA: 0.2 mg/dL (ref 0.0–1.0)
pH: 5.5 (ref 5.0–8.0)

## 2018-02-14 LAB — POCT PREGNANCY, URINE: Preg Test, Ur: NEGATIVE

## 2018-02-14 MED ORDER — MELOXICAM 7.5 MG PO TABS: 7.5000 mg | ORAL_TABLET | Freq: Every day | ORAL | 0 refills | Status: DC

## 2018-02-14 NOTE — Discharge Instructions (Addendum)
Urine pregnancy negative.  Urine dipstick today negative for UTI.  I will send for culture for definite testing.  As discussed, symptoms could also be due to constipation.  You can get over-the-counter MiraLAX, drink this daily for the next 7-14 days, it will help regulate your bowels.  You can also get over-the-counter laxatives such as colace for more aggressive treatment for constipation.  I have prescribed Mobic, this can help with your back pain.  You can also try position changes to see if this helps with muscle pain.  Follow-up with PCP for further evaluation and treatment needed.  Follow-up with OB/GYN for further evaluation and management of vaginal discharge.

## 2018-02-14 NOTE — ED Triage Notes (Signed)
PT reports lower abdominal pain, lower back pain, frequent urination, urine odor, and green tint to urine. PT has taken multiple antibiotics in past 3 months.

## 2018-02-14 NOTE — ED Provider Notes (Signed)
[facility]    CSN: [id number] Arrival date & time: 02/14/18  1902     History   Chief Complaint Chief Complaint  Patient presents with  . Abdominal Pain  . Urinary Frequency    HPI Lori Moses is a 22 y.o. female.   22 year old female comes in for continued urinary frequency, odor after being treated for UTI 4 days ago.  She also complains of chronic low back pain and hip pain and low abdominal pain.  States she has been evaluated multiple times for all the symptoms, and has not found an answer.  Low back pain is sharp/aching that is constant, states sitting down makes it worse, denies worsening with movement.  States symptoms helps with muscle relaxant.  States has been having left hip pain/aching that is worse with laying on the side.  Was told to have possible arthritis.  Low abdominal pain, specifically on the left lower quadrant, aching, intermittent.  No specific aggravating/alleviating factor.  She can have occasional nausea, denies nausea currently.  No vomiting.  Has a history of constipation, bowel movements once a week with straining.  She was recently tested for STDs with cytology, negative GC, trichomoniasis, BV, yeast.  Sexually active with one female partner, condom use.  No other birth control use.      Past Medical History:  Diagnosis Date  . Medical history non-contributory   . PID (acute pelvic inflammatory disease) 05/2017    Patient Active Problem List   Diagnosis Date Noted  . Pelvic pain 11/04/2017  . Vaginal discharge 11/04/2017  . Chlamydia 09/20/2017    Past Surgical History:  Procedure Laterality Date  . HEART TUMOR EXCISION     as an infant    OB History    Gravida Para Term Preterm AB Living   0 0 0 0 0 0   SAB TAB Ectopic Multiple Live Births   0 0 0 0         Home Medications    Prior to Admission medications   Medication Sig Start Date End Date Taking? Authorizing Provider  nitrofurantoin,  macrocrystal-monohydrate, (MACROBID) 100 MG capsule Take 1 capsule (100 mg total) by mouth 2 (two) times daily. 02/10/18  Yes Elenora Fender, Walidah N, CNM  omeprazole (PRILOSEC) 40 MG capsule Take 1 capsule (40 mg total) by mouth 2 (two) times daily for 14 days. 02/10/18 02/24/18 Yes Elenora Fender, Walidah N, CNM  cyclobenzaprine (FLEXERIL) 10 MG tablet Take 1 tablet (10 mg total) by mouth every 8 (eight) hours as needed for muscle spasms. 12/12/17   Brock Bad, MD  fluconazole (DIFLUCAN) 150 MG tablet Take 1 tablet (150 mg total) by mouth every 3 (three) days. Patient not taking: Reported on 12/12/2017 11/22/17   Loletta Specter, PA-C  meloxicam (MOBIC) 7.5 MG tablet Take 1 tablet (7.5 mg total) by mouth daily. 02/14/18   Belinda Fisher, PA-C    Family History Family History  Problem Relation Age of Onset  . Arthritis Mother   . Arthritis Maternal Grandmother   . Breast cancer Paternal Grandmother   . Down syndrome Sister     Social History Social History   Tobacco Use  . Smoking status: Never Smoker  . Smokeless tobacco: Never Used  Substance Use Topics  . Alcohol use: No  . Drug use: No     Allergies   Flagyl [metronidazole]; Other; and Penicillins   Review of Systems Review of Systems  Reason unable to perform ROS: See HPI  as above.     Physical Exam Triage Vital Signs ED Triage Vitals  Enc Vitals Group     BP 02/14/18 1953 126/84     Pulse Rate 02/14/18 1953 99     Resp 02/14/18 1953 16     Temp 02/14/18 1953 98.9 F (37.2 C)     Temp Source 02/14/18 1953 Oral     SpO2 02/14/18 1953 100 %     Weight 02/14/18 1954 158 lb (71.7 kg)     Height --      Head Circumference --      Peak Flow --      Pain Score 02/14/18 1954 7     Pain Loc --      Pain Edu? --      Excl. in GC? --    No data found.  Updated Vital Signs BP 126/84   Pulse 99   Temp 98.9 F (37.2 C) (Oral)   Resp 16   Wt 158 lb (71.7 kg)   LMP 02/02/2018 (Exact Date)   SpO2 100%   BMI 24.75 kg/m    Physical Exam  Constitutional: She is oriented to person, place, and time. She appears well-developed and well-nourished. No distress.  HENT:  Head: Normocephalic and atraumatic.  Eyes: Conjunctivae are normal. Pupils are equal, round, and reactive to light.  Cardiovascular: Normal rate, regular rhythm and normal heart sounds. Exam reveals no gallop and no friction rub.  No murmur heard. Pulmonary/Chest: Effort normal and breath sounds normal. She has no wheezes. She has no rales.  Abdominal: Soft. Bowel sounds are normal. She exhibits no mass. There is no tenderness. There is no rebound, no guarding and no CVA tenderness.  Musculoskeletal:  No tenderness on palpation of the back Full range of motion. Strength normal and equal bilaterally. Sensation intact and equal bilaterally.   Neurological: She is alert and oriented to person, place, and time.  Skin: Skin is warm and dry.  Psychiatric: She has a normal mood and affect. Her behavior is normal. Judgment normal.     UC Treatments / Results  Labs (all labs ordered are listed, but only abnormal results are displayed) Labs Reviewed  POCT URINALYSIS DIP (DEVICE) - Abnormal; Notable for the following components:      Result Value   Ketones, ur TRACE (*)    All other components within normal limits  URINE CULTURE  POCT PREGNANCY, URINE    EKG  EKG Interpretation None       Radiology No results found.  Procedures Procedures (including critical care time)  Medications Ordered in UC Medications - No data to display   Initial Impression / Assessment and Plan / UC Course  I have reviewed the triage vital signs and the nursing notes.  Pertinent labs & imaging results that were available during my care of the patient were reviewed by me and considered in my medical decision making (see chart for details).    Urine pregnancy negative.  Dipstick without leukocytes or nitrates.  Given recent treatment of UTI without  resolution of symptoms, will send urine culture.  Patient with low abdominal pain that has been intermittent.  No alarming signs on exam.  Given history of constipation, will have patient take MiraLAX and Colace.  Push fluids.  Patient with chronic low back pain that has been evaluated multiple times.  Currently on muscle relaxant, not on any pain relievers/NSAIDs.  Will start Mobic to see if it helps with the pain. Will provide  PCP information, patient to establish PCP care for further evaluation and management of chronic conditions.  Return precautions given.  Patient expresses understanding and agrees to plan.    Final Clinical Impressions(s) / UC Diagnoses   Final diagnoses:  Left lower quadrant pain    ED Discharge Orders        Ordered    meloxicam (MOBIC) 7.5 MG tablet  Daily     02/14/18 2036        Belinda FisherYu, Amy V, PA-C 02/14/18 2301

## 2018-02-16 LAB — URINE CULTURE

## 2018-02-20 NOTE — Telephone Encounter (Signed)
Pt returned call regarding test results from recent visit, pt verbalized understanding. Reports still having symptoms and will follow up with her PCP.

## 2018-02-20 NOTE — Telephone Encounter (Signed)
Attempted call regarding test results from recent visit, no answer and voicemail not available, will try again.

## 2018-03-13 ENCOUNTER — Encounter: Payer: Self-pay | Admitting: Family Medicine

## 2018-03-13 ENCOUNTER — Ambulatory Visit (INDEPENDENT_AMBULATORY_CARE_PROVIDER_SITE_OTHER): Payer: Self-pay | Admitting: Family Medicine

## 2018-03-13 ENCOUNTER — Other Ambulatory Visit: Payer: Self-pay

## 2018-03-13 VITALS — BP 106/72 | HR 77 | Temp 98.3°F | Ht 67.0 in | Wt 152.0 lb

## 2018-03-13 DIAGNOSIS — K219 Gastro-esophageal reflux disease without esophagitis: Secondary | ICD-10-CM

## 2018-03-13 DIAGNOSIS — Z3009 Encounter for other general counseling and advice on contraception: Secondary | ICD-10-CM

## 2018-03-13 DIAGNOSIS — Z3490 Encounter for supervision of normal pregnancy, unspecified, unspecified trimester: Secondary | ICD-10-CM | POA: Insufficient documentation

## 2018-03-13 DIAGNOSIS — Z3202 Encounter for pregnancy test, result negative: Secondary | ICD-10-CM

## 2018-03-13 DIAGNOSIS — R112 Nausea with vomiting, unspecified: Secondary | ICD-10-CM

## 2018-03-13 DIAGNOSIS — R8290 Unspecified abnormal findings in urine: Secondary | ICD-10-CM

## 2018-03-13 LAB — POCT URINE PREGNANCY: Preg Test, Ur: NEGATIVE

## 2018-03-13 LAB — POCT URINALYSIS DIP (MANUAL ENTRY)
BILIRUBIN UA: NEGATIVE mg/dL
Bilirubin, UA: NEGATIVE
Glucose, UA: NEGATIVE mg/dL
Nitrite, UA: NEGATIVE
PH UA: 6 (ref 5.0–8.0)
PROTEIN UA: NEGATIVE mg/dL
RBC UA: NEGATIVE
SPEC GRAV UA: 1.01 (ref 1.010–1.025)
Urobilinogen, UA: 0.2 E.U./dL

## 2018-03-13 LAB — POCT UA - MICROSCOPIC ONLY

## 2018-03-13 NOTE — Assessment & Plan Note (Signed)
Patient counseled on several birth control options and willing to consider patch. Handout provided today.

## 2018-03-13 NOTE — Progress Notes (Signed)
Subjective:  Lori Moses is a 22 y.o. female who presents to the [facility] today to establish care with multiple medical complaints  HPI:  Previously seen in [facility] pediatricians.  Also at [facility] clinic.  She would like to talk about having chest pain, nausea and vomiting related to her acid reflux, frequent urination with greenish vaginal discharge, headaches, hip pain.    Acid reflux She states that the most important problem today is her acid reflux as her vomiting has been worsening over the last 1 week. Started feeling nauseous for the last month.  Is most related to the foods that she eats, she has noticed specific instances such as when she ate zaxbys spicy sauce, or [employer] strawberry milkshake, or lasagna, or after ibuprofen she will get nauseous and subsequently vomited.  She does burning sensation in chest when lay on L side sometimes. Was on omeprazole, stopped taking 2 weeks ago because does not like taking medications. Did not feel like it helped when she was on it.   No weight loss.  In fact she thinks that she is gained weight recently. She characterizes herself as someone who pays overly close attention to any symptoms that she might have.  Urine/vaginal discharge complaints. She states that she sometimes has greenish vaginal discharge that last occurred 3 weeks ago but now her vaginal discharge is normal.  She feels like she also has a strong odor in her urine with a green tint. No dysuria.  No fevers or chills.  She does chronically have abdominal pain particularly bilateral lower region.    ROS: per HPI, otherwise all systems reviewed and negative  PMH:  The following were reviewed and entered/updated in epic: Past Medical History:  Diagnosis Date  . Acid reflux   . H/O repair of patent ductus arteriosus   . Medical history non-contributory   . PID (acute pelvic inflammatory disease) 05/2017   Patient Active Problem List   Diagnosis Date Noted    . Nausea and vomiting 03/13/2018  . Acid reflux 03/13/2018  . Pelvic pain 11/04/2017  . Vaginal discharge 11/04/2017  . Chlamydia 09/20/2017   Past Surgical History:  Procedure Laterality Date  . PATENT DUCTUS ARTERIOUS REPAIR     as an infant    Family History  Problem Relation Age of Onset  . Arthritis Mother   . Arthritis Maternal Grandmother   . Breast cancer Paternal Grandmother   . Down syndrome Sister   . Diabetes Maternal Uncle     Medications- reviewed and updated Current Outpatient Medications  Medication Sig Dispense Refill  . meloxicam (MOBIC) 7.5 MG tablet Take 1 tablet (7.5 mg total) by mouth daily. 15 tablet 0   No current facility-administered medications for this visit.     Allergies-reviewed and updated Allergies  Allergen Reactions  . Flagyl [Metronidazole] Hives  . Other Hives    potatoes  . Penicillins Hives    Has patient had a PCN reaction causing immediate rash, facial/tongue/throat swelling, SOB or lightheadedness with hypotension: Yes Has patient had a PCN reaction causing severe rash involving mucus membranes or skin necrosis: No Has patient had a PCN reaction that required hospitalization No Has patient had a PCN reaction occurring within the last 10 years: No If all of the above answers are "NO", then may proceed with Cephalosporin use.     Social History   Socioeconomic History  . Marital status: Single    Spouse name: Not on file  . Number  of children: Not on file  . Years of education: Not on file  . Highest education level: Not on file  Occupational History  . 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 Occupation: call center  Social Needs  . Financial resource strain: Not on file  . Food insecurity:    Worry: Not on file    Inability: Not on file  . Transportation needs:    Medical: Not on file    Non-medical: Not on file  Tobacco Use  . Smoking status: Never Smoker  . Smokeless tobacco: Never Used   Substance and Sexual Activity  . Alcohol use: No  . Drug use: No  . Sexual activity: Yes    Partners: Male    Birth control/protection: None  Lifestyle  . Physical activity:    Days per week: 0 days    Minutes per session: Not on file  . Stress: Not on file  Relationships  . Social connections:    Talks on phone: Not on file    Gets together: Not on file    Attends religious service: Not on file    Active member of club or organization: Not on file    Attends meetings of clubs or organizations: Not on file    Relationship status: Not on file  Other Topics Concern  . Not on file  Social History Narrative   Lives with mother and sister.    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 Goal to open up own daycare.   She is sexually active and does not always use a condom.  She does not want to be pregnant at this time but would not be upset if she got pregnant. She is not interested in the IUD, pills, Depo-Provera, Nexplanon as she has had friends who have had issues with all of the above.   Objective:  Physical Exam: BP 106/72   Pulse 77   Temp 98.3 F (36.8 C) (Oral)   Ht 5\' 7"  (1.702 m)   Wt 152 lb (68.9 kg)   LMP 03/05/2018   SpO2 92%   BMI 23.81 kg/m   Gen: NAD, resting comfortably CV: RRR with no murmurs appreciated Pulm: NWOB, CTAB with no crackles, wheezes, or rhonchi GI: Normal bowel sounds present. Soft, Nontender, Nondistended. MSK: no edema, cyanosis, or clubbing noted Skin: warm, dry Neuro: grossly normal, moves all extremities Psych: Normal affect and thought content  Results for orders placed or performed in visit on 03/13/18 (from the past 72 hour(s))  POCT urine pregnancy     Status: None   Collection Time: 03/13/18  4:18 PM  Result Value Ref Range   Preg Test, Ur Negative Negative  POCT urinalysis dipstick     Status: Abnormal   Collection Time: 03/13/18  4:18 PM  Result Value Ref Range   Color, UA yellow yellow    Clarity, UA clear clear   Glucose, UA negative negative mg/dL   Bilirubin, UA negative negative   Ketones, POC UA negative negative mg/dL   Spec Grav, UA 1.610 9.604 - 1.025   Blood, UA negative negative   pH, UA 6.0 5.0 - 8.0   Protein Ur, POC negative negative mg/dL   Urobilinogen, UA 0.2 0.2 or 1.0 E.U./dL   Nitrite, UA Negative Negative   Leukocytes, UA Small (1+) (A) Negative  POCT UA - Microscopic Only     Status: None   Collection Time: 03/13/18  4:18 PM  Result Value  Ref Range   WBC, Ur, HPF, POC 1-5    RBC, urine, microscopic NONE    Bacteria, U Microscopic TRACE    Epithelial cells, urine per micros >20      Assessment/Plan:  Acid reflux Patient seems to have nausea and vomiting after eating certain acidic foods with a burning sensation in her chest is most consistent with acid reflux.  Discussed a trial of H2 blocker versus dietary changes given patient's reluctance to start medications.  Patient opted to make dietary changes so was counseled on acid inducing foods today and provided a list to take home.  General counseling and advice on female contraception Patient counseled on several birth control options and willing to consider patch. Handout provided today.  Abnormal urine odor Patient does not have dysuria but rather describes a strong odor. She does have concerns regarding a greenish tint but given her history of greenish vaginal discharge advised that patient RTC when she is symptomatic for a wet prep.   Patient advised to return to clinic at her convenience to discuss her multiple complaints  Leland HerElsia J Jerene Yeager, DO PGY-2, [facility] 03/13/2018 3:46 PM

## 2018-03-13 NOTE — Assessment & Plan Note (Signed)
Patient seems to have nausea and vomiting after eating certain acidic foods with a burning sensation in her chest is most consistent with acid reflux.  Discussed a trial of H2 blocker versus dietary changes given patient's reluctance to start medications.  Patient opted to make dietary changes so was counseled on acid inducing foods today and provided a list to take home.

## 2018-03-13 NOTE — Patient Instructions (Addendum)
It was good to see you today!  Please check-out at the front desk before leaving the clinic. Make an appointment at your convenience for your next visit to talk about some of the other things on your list.   Please bring all of your medications with you to each visit.   Sign up for My Chart to have easy access to your labs results, and communication with your primary care physician.  Feel free to call with any questions or concerns at any time, at [phone].   Take care,  Dr. Leland HerElsia J Nimah Uphoff, DO [facility]   Food Choices for Gastroesophageal Reflux Disease, Adult When you have gastroesophageal reflux disease (GERD), the foods you eat and your eating habits are very important. Choosing the right foods can help ease your discomfort. What guidelines do I need to follow?  Choose fruits, vegetables, whole grains, and low-fat dairy products.  Choose low-fat meat, fish, and poultry.  Limit fats such as oils, salad dressings, butter, nuts, and avocado.  Keep a food diary. This helps you identify foods that cause symptoms.  Avoid foods that cause symptoms. These may be different for everyone.  Eat small meals often instead of 3 large meals a day.  Eat your meals slowly, in a place where you are relaxed.  Limit fried foods.  Cook foods using methods other than frying.  Avoid drinking alcohol.  Avoid drinking large amounts of liquids with your meals.  Avoid bending over or lying down until 2-3 hours after eating. What foods are not recommended? These are some foods and drinks that may make your symptoms worse: Vegetables Tomatoes. Tomato juice. Tomato and spaghetti sauce. Chili peppers. Onion and garlic. Horseradish. Fruits Oranges, grapefruit, and lemon (fruit and juice). Meats High-fat meats, fish, and poultry. This includes hot dogs, ribs, ham, sausage, salami, and bacon. Dairy Whole milk and chocolate milk. Sour cream. Cream. Butter. Ice cream. Cream  cheese. Drinks Coffee and tea. Bubbly (carbonated) drinks or energy drinks. Condiments Hot sauce. Barbecue sauce. Sweets/Desserts Chocolate and cocoa. Donuts. Peppermint and spearmint. Fats and Oils High-fat foods. This includes Jamaica[place] fries and potato chips. Other Vinegar. Strong spices. This includes black pepper, white pepper, red pepper, cayenne, curry powder, cloves, ginger, and chili powder. The items listed above may not be a complete list of foods and drinks to avoid. Contact your dietitian for more information. This information is not intended to replace advice given to you by your health care provider. Make sure you discuss any questions you have with your health care provider. Document Released: 05/23/2012 Document Revised: 04/29/2016 Document Reviewed: 09/26/2013 Elsevier Interactive Patient Education  2017 Elsevier [employer].  Ethinyl Estradiol; Norelgestromin skin patches What is this medicine? ETHINYL ESTRADIOL;NORELGESTROMIN (ETH in il es tra DYE ole; nor el JES troe min) skin patch is used as a contraceptive (birth control method). This medicine combines two types of female hormones, an estrogen and a progestin. This patch is used to prevent ovulation and pregnancy. This medicine may be used for other purposes; ask your health care provider or pharmacist if you have questions. COMMON BRAND NAME(S): Ortho Christianne BorrowEvra, Xulane What should I tell my health care provider before I take this medicine? They need to know if you have or ever had any of these conditions: -abnormal vaginal bleeding -blood vessel disease or blood clots -breast, cervical, endometrial, ovarian, liver, or uterine cancer -diabetes -gallbladder disease -heart disease or recent heart attack -high blood pressure -high cholesterol -kidney disease -liver disease -migraine headaches -  stroke -systemic lupus erythematosus (SLE) -tobacco smoker -an unusual or allergic reaction to estrogens, progestins, other  medicines, foods, dyes, or preservatives -pregnant or trying to get pregnant -breast-feeding How should I use this medicine? This patch is applied to the skin. Follow the directions on the prescription label. Apply to clean, dry, healthy skin on the buttock, abdomen, upper outer arm or upper torso, in a place where it will not be rubbed by tight clothing. Do not use lotions or other cosmetics on the site where the patch will go. Press the patch firmly in place for 10 seconds to ensure good contact with the skin. Change the patch every 7 days on the same day of the week for 3 weeks. You will then have a break from the patch for 1 week, after which you will apply a new patch. Do not use your medicine more often than directed. Contact your pediatrician regarding the use of this medicine in children. Special care may be needed. This medicine has been used in female children who have started having menstrual periods. A patient package insert for the product will be given with each prescription and refill. Read this sheet carefully each time. The sheet may change frequently. Overdosage: If you think you have taken too much of this medicine contact a poison control center or emergency room at once. NOTE: This medicine is only for you. Do not share this medicine with others. What if I miss a dose? You will need to replace your patch once a week as directed. If your patch is lost or falls off, contact your health care professional for advice. You may need to use another form of birth control if your patch has been off for more than 1 day. What may interact with this medicine? Do not take this medicine with the following medication: -dasabuvir; ombitasvir; paritaprevir; ritonavir -ombitasvir; paritaprevir; ritonavir This medicine may also interact with the following medications: -acetaminophen -antibiotics or medicines for infections, especially rifampin, rifabutin, rifapentine, and griseofulvin, and possibly  penicillins or tetracyclines -aprepitant -ascorbic acid (vitamin C) -atorvastatin -barbiturate medicines, such as phenobarbital -bosentan -carbamazepine -caffeine -clofibrate -cyclosporine -dantrolene -doxercalciferol -felbamate -grapefruit juice -hydrocortisone -medicines for anxiety or sleeping problems, such as diazepam or temazepam -medicines for diabetes, including pioglitazone -modafinil -mycophenolate -nefazodone -oxcarbazepine -phenytoin -prednisolone -ritonavir or other medicines for HIV infection or AIDS -rosuvastatin -selegiline -soy isoflavones supplements -St. John's wort -tamoxifen or raloxifene -theophylline -thyroid hormones -topiramate -warfarin This list may not describe all possible interactions. Give your health care provider a list of all the medicines, herbs, non-prescription drugs, or dietary supplements you use. Also tell them if you smoke, drink alcohol, or use illegal drugs. Some items may interact with your medicine. What should I watch for while using this medicine? Visit your doctor or health care professional for regular checks on your progress. You will need a regular breast and pelvic exam and Pap smear while on this medicine. Use an additional method of contraception during the first cycle that you use this patch. If you have any reason to think you are pregnant, stop using this medicine right away and contact your doctor or health care professional. If you are using this medicine for hormone related problems, it may take several cycles of use to see improvement in your condition. Smoking increases the risk of getting a blood clot or having a stroke while you are using hormonal birth control, especially if you are more than 20 years old. You are strongly advised not to smoke. This  medicine can make your body retain fluid, making your fingers, hands, or ankles swell. Your blood pressure can go up. Contact your doctor or health care professional  if you feel you are retaining fluid. This medicine can make you more sensitive to the sun. Keep out of the sun. If you cannot avoid being in the sun, wear protective clothing and use sunscreen. Do not use sun lamps or tanning beds/booths. If you wear contact lenses and notice visual changes, or if the lenses begin to feel uncomfortable, consult your eye care specialist. In some women, tenderness, swelling, or minor bleeding of the gums may occur. Notify your dentist if this happens. Brushing and flossing your teeth regularly may help limit this. See your dentist regularly and inform your dentist of the medicines you are taking. If you are going to have elective surgery or a MRI, you may need to stop using this medicine before the surgery or MRI. Consult your health care professional for advice. This medicine does not protect you against HIV infection (AIDS) or any other sexually transmitted diseases. What side effects may I notice from receiving this medicine? Side effects that you should report to your doctor or health care professional as soon as possible: -breast tissue changes or discharge -changes in vaginal bleeding during your period or between your periods -chest pain -coughing up blood -dizziness or fainting spells -headaches or migraines -leg, arm or groin pain -severe or sudden headaches -stomach pain (severe) -sudden shortness of breath -sudden loss of coordination, especially on one side of the body -speech problems -symptoms of vaginal infection like itching, irritation or unusual discharge -tenderness in the upper abdomen -vomiting -weakness or numbness in the arms or legs, especially on one side of the body -yellowing of the eyes or skin Side effects that usually do not require medical attention (report to your doctor or health care professional if they continue or are bothersome): -breakthrough bleeding and spotting that continues beyond the 3 initial cycles of  pills -breast tenderness -mood changes, anxiety, depression, frustration, anger, or emotional outbursts -increased sensitivity to sun or ultraviolet light -nausea -skin rash, acne, or brown spots on the skin -weight gain (slight) This list may not describe all possible side effects. Call your doctor for medical advice about side effects. You may report side effects to FDA at 1-800-FDA-1088. Where should I keep my medicine? Keep out of the reach of children. Store at room temperature between 15 and 30 degrees C (59 and 86 degrees F). Keep the patch in its pouch until time of use. Throw away any unused medicine after the expiration date. Dispose of used patches properly. Since a used patch may still contain active hormones, fold the patch in half so that it sticks to itself prior to disposal. Throw away in a place where children or pets cannot reach. NOTE: This sheet is a summary. It may not cover all possible information. If you have questions about this medicine, talk to your doctor, pharmacist, or health care provider.  2018 Elsevier/Gold Standard (2016-08-02 07:59:03)

## 2018-04-24 ENCOUNTER — Telehealth: Payer: Self-pay | Admitting: Internal Medicine

## 2018-04-24 ENCOUNTER — Encounter: Payer: Self-pay | Admitting: Internal Medicine

## 2018-04-24 ENCOUNTER — Ambulatory Visit (INDEPENDENT_AMBULATORY_CARE_PROVIDER_SITE_OTHER): Payer: Medicaid Other | Admitting: Internal Medicine

## 2018-04-24 ENCOUNTER — Other Ambulatory Visit: Payer: Self-pay

## 2018-04-24 VITALS — BP 130/78 | HR 83 | Temp 97.8°F | Ht 67.0 in | Wt 151.4 lb

## 2018-04-24 DIAGNOSIS — Z3009 Encounter for other general counseling and advice on contraception: Secondary | ICD-10-CM

## 2018-04-24 DIAGNOSIS — K219 Gastro-esophageal reflux disease without esophagitis: Secondary | ICD-10-CM

## 2018-04-24 DIAGNOSIS — R350 Frequency of micturition: Secondary | ICD-10-CM | POA: Insufficient documentation

## 2018-04-24 LAB — POCT URINALYSIS DIP (MANUAL ENTRY)
BILIRUBIN UA: NEGATIVE mg/dL
Bilirubin, UA: NEGATIVE
Blood, UA: NEGATIVE
Glucose, UA: NEGATIVE mg/dL
NITRITE UA: NEGATIVE
PH UA: 7 (ref 5.0–8.0)
PROTEIN UA: NEGATIVE mg/dL
Spec Grav, UA: 1.01 (ref 1.010–1.025)
Urobilinogen, UA: 0.2 E.U./dL

## 2018-04-24 LAB — POCT UA - MICROSCOPIC ONLY

## 2018-04-24 LAB — POCT URINE PREGNANCY: PREG TEST UR: NEGATIVE

## 2018-04-24 MED ORDER — OMEPRAZOLE 20 MG PO CPDR: 20.0000 mg | DELAYED_RELEASE_CAPSULE | Freq: Every day | ORAL | 3 refills | Status: DC

## 2018-04-24 NOTE — Assessment & Plan Note (Signed)
Patient sexually active without condom use or any other form of contraception. Discussed likelihood of becoming pregnant given her sexual activity and no contraception. Pointed out that patient states she does not want to get pregnant, but her actions are not consistent with this. Offered to discuss forms of contraception with patient, however she declines. Would continue to discuss this with patient at future visits.  Upreg neg today.

## 2018-04-24 NOTE — Assessment & Plan Note (Signed)
No improvement with dietary changes. Discussed beginning medication today, as brief trial of omeprazole helped patient in past, however patient declines. Discussed that there are not many other options as dietary changes not helpful and patient refusing medication trial. Patient still does not with to begin medication today.

## 2018-04-24 NOTE — Progress Notes (Signed)
   Subjective:   Patient: Lori Moses       Birthdate: [birth year]       MRN: [id number]      HPI  Lori Moses is a 22 y.o. female presenting for urinary frequency and GERD sx.   Urinary frequency/concern for pregnancy Patient says she urinates large volumes every 20 minutes. She says this has been going on for past three months. Says she wakes up in the middle of the night to urinate as well. Denies dysuria. Says urine is yellow to clear in appearance. Drinks one-half to one bottle of water per day and no additional fluids. Patient thinks she is either pregnant or has a UTI due to increased frequency. Has had three negative urine pregnancy tests at home.  Patient is sexually active. Does not use condoms and is not on any form of birth control. Does not wish to become pregnant. Is not interested in discussing birth control today.   GERD sx Patient still experiencing same sx that she was at last visit (vomiting, nausea) despite making dietary changes. Beginning GERD medication at last visit was discussed, however patient declined as she does not like taking medications. Patient again opposed to beginning any sort of medication today.   Smoking status reviewed. Patient is never smoker.   Review of Systems See HPI.     Objective:  Physical Exam  Constitutional: She is oriented to person, place, and time. She appears well-developed and well-nourished. No distress.  HENT:  Head: Normocephalic and atraumatic.  Eyes: EOM are normal. Right eye exhibits no discharge. Left eye exhibits no discharge.  Neck: Normal range of motion.  Pulmonary/Chest: Effort normal. No respiratory distress.  Musculoskeletal: Normal range of motion.  Neurological: She is alert and oriented to person, place, and time.  Psychiatric: She has a normal mood and affect. Her behavior is normal.   Assessment & Plan:  Acid reflux No improvement with dietary changes. Discussed beginning medication today, as brief  trial of omeprazole helped patient in past, however patient declines. Discussed that there are not many other options as dietary changes not helpful and patient refusing medication trial. Patient still does not with to begin medication today.   General counseling and advice on female contraception Patient sexually active without condom use or any other form of contraception. Discussed likelihood of becoming pregnant given her sexual activity and no contraception. Pointed out that patient states she does not want to get pregnant, but her actions are not consistent with this. Offered to discuss forms of contraception with patient, however she declines. Would continue to discuss this with patient at future visits.  Upreg neg today.   Urinary frequency UA with no signs of infection (does have trace leuks, however has had leuks on previous samples as well, and amount of leuks today less than prior). Would also not expect UTI without dysuria or some kind of discomfort. Upreg neg as well, and would also not expect increased frequency until later in a pregnancy. Undiagnosed DM on differential, however no weight loss or other signs of hyperglycemia, and sx also ongoing for over three months. Would also possibly have ketones on UA if truly symptomatically hyperglycemic for that long. Patient not wishing to stay for results of UA. Will call when available and encourage her to begin keeping a diary of urinary frequency to better assess urinary patterns.    Tarri Abernethy, MD, MPH PGY-3 [facility] Pager [phone]

## 2018-04-24 NOTE — Assessment & Plan Note (Addendum)
UA with no signs of infection (does have trace leuks, however has had leuks on previous samples as well, and amount of leuks today less than prior). Would also not expect UTI without dysuria or some kind of discomfort. Upreg neg as well, and would also not expect increased frequency until later in a pregnancy. Undiagnosed DM on differential, however no weight loss or other signs of hyperglycemia, and sx also ongoing for over three months. Would also possibly have ketones on UA if truly symptomatically hyperglycemic for that long. Patient not wishing to stay for results of UA. Will call when available and encourage her to begin keeping a diary of urinary frequency to better assess urinary patterns.

## 2018-04-24 NOTE — Telephone Encounter (Signed)
Called patient to inform her of neg Upreg and neg UA.  Patient now wanting to begin omeprazole. Will prescribe.  Tarri Abernethy, MD, MPH PGY-3 [facility] Pager [phone]

## 2018-04-24 NOTE — Patient Instructions (Signed)
It was nice meeting you today Zanovia!  I will call you when the results of your urine testing have returned.   If you have any questions or concerns in the meantime, please feel free to call the clinic.   Be well,  Dr. Natale Milch

## 2018-05-18 ENCOUNTER — Encounter (HOSPITAL_COMMUNITY): Payer: Self-pay | Admitting: Emergency Medicine

## 2018-05-18 ENCOUNTER — Ambulatory Visit (HOSPITAL_COMMUNITY)
Admission: EM | Admit: 2018-05-18 | Discharge: 2018-05-18 | Disposition: A | Discharge status: Home / Self Care | Payer: Medicaid Other | Attending: Family Medicine | Admitting: Family Medicine

## 2018-05-18 DIAGNOSIS — Z803 Family history of malignant neoplasm of breast: Secondary | ICD-10-CM | POA: Insufficient documentation

## 2018-05-18 DIAGNOSIS — Z880 Allergy status to penicillin: Secondary | ICD-10-CM | POA: Insufficient documentation

## 2018-05-18 DIAGNOSIS — Z881 Allergy status to other antibiotic agents status: Secondary | ICD-10-CM | POA: Insufficient documentation

## 2018-05-18 DIAGNOSIS — K219 Gastro-esophageal reflux disease without esophagitis: Secondary | ICD-10-CM | POA: Insufficient documentation

## 2018-05-18 DIAGNOSIS — N898 Other specified noninflammatory disorders of vagina: Secondary | ICD-10-CM

## 2018-05-18 DIAGNOSIS — Z833 Family history of diabetes mellitus: Secondary | ICD-10-CM | POA: Insufficient documentation

## 2018-05-18 DIAGNOSIS — Z8279 Family history of other congenital malformations, deformations and chromosomal abnormalities: Secondary | ICD-10-CM | POA: Insufficient documentation

## 2018-05-18 DIAGNOSIS — Z3202 Encounter for pregnancy test, result negative: Secondary | ICD-10-CM

## 2018-05-18 DIAGNOSIS — R1110 Vomiting, unspecified: Secondary | ICD-10-CM

## 2018-05-18 DIAGNOSIS — R51 Headache: Secondary | ICD-10-CM

## 2018-05-18 DIAGNOSIS — Z8261 Family history of arthritis: Secondary | ICD-10-CM | POA: Insufficient documentation

## 2018-05-18 DIAGNOSIS — E860 Dehydration: Secondary | ICD-10-CM

## 2018-05-18 DIAGNOSIS — Z79899 Other long term (current) drug therapy: Secondary | ICD-10-CM | POA: Insufficient documentation

## 2018-05-18 DIAGNOSIS — R519 Headache, unspecified: Secondary | ICD-10-CM

## 2018-05-18 DIAGNOSIS — Z791 Long term (current) use of non-steroidal anti-inflammatories (NSAID): Secondary | ICD-10-CM | POA: Insufficient documentation

## 2018-05-18 LAB — POCT URINALYSIS DIP (DEVICE)
GLUCOSE, UA: NEGATIVE mg/dL
Hgb urine dipstick: NEGATIVE
Ketones, ur: 40 mg/dL — AB
Leukocytes, UA: NEGATIVE
NITRITE: NEGATIVE
PROTEIN: 30 mg/dL — AB
Specific Gravity, Urine: 1.025 (ref 1.005–1.030)
Urobilinogen, UA: 1 mg/dL (ref 0.0–1.0)
pH: 6 (ref 5.0–8.0)

## 2018-05-18 LAB — POCT PREGNANCY, URINE: PREG TEST UR: NEGATIVE

## 2018-05-18 MED ORDER — ONDANSETRON HCL 4 MG PO TABS: 4.0000 mg | ORAL_TABLET | Freq: Four times a day (QID) | ORAL | 0 refills | Status: DC | PRN

## 2018-05-18 NOTE — Discharge Instructions (Addendum)
Urine pregnancy negative Urine did not show signs of infection, but that you are dehydrated Continue to push fluids Zofran prescribed Urine cytology obtained.  WE will follow up with you regarding the results of your test.   Take medication as directed Follow up with PCP regarding further evaluation and management of chronic conditions like headache and abdominal pain  DIET Instructions: 30 minutes after taking nausea medicine, begin with sips of clear liquids. If able to hold down 2 - 4 ounces for 30 minutes, begin drinking more. Increase your fluid intake to replace losses. Clear liquids only for 24 hours (water, tea, sport drinks, clear flat ginger ale or cola and juices, broth, jello, popsicles, ect). Advance to bland foods, applesauce, rice, baked or boiled chicken, ect. Avoid milk, greasy foods and anything that doesn?t agree with you. If symptoms persists follow up with PCP If you experience new or worsening symptoms return or go to ER If vomiting persists and you are unable to hold fluids, or if you develop high fever or abdominal pain, return here, see your doctor or go to the ER.   Return or go to the ER if ou have any new or worsening symptoms

## 2018-05-18 NOTE — ED Triage Notes (Signed)
Pt c/o vomiting since yesterday, and headaches/dizziness x2 months.

## 2018-05-18 NOTE — ED Provider Notes (Signed)
[facility]   [id number] 05/18/18 Arrival Time: 1502  SUBJECTIVE:  Lori Moses is a 22 y.o. female who presents with complaint of vomiting that began abruptly last night.  Denies a precipitating event, or trauma.  Denies recent antibiotic use, travel, or close contacts with similar symptoms.   Last unprotected sex was 04/22/18.  Partner does not have symptoms.  Has tried acid reflux medication without relief.  Denies alleviating or aggravating factors.  Denies similar symptoms in the past.  Last BM yesterday and normal. Complains of associated increased vaginal discharge that is green in color.    Patient also complains of chronic HA for the past 2 months.  Denies a precipitating event, but states the symptoms last the whole day.  Reports she cannot take ibuprofen due to her acid reflux.  Denies aggravating factors.  Denies previous symptoms in the past.    Denies fever, chills,  nausea, diarrhea, constipation, dysuria, difficulty urinating, increased frequency or urgency, flank pain, loss of bowel or bladder function.    Patient's last menstrual period was 05/06/2018.  ROS: As per HPI.  Past Medical History:  Diagnosis Date  . Acid reflux   . H/O repair of patent ductus arteriosus   . Medical history non-contributory   . PID (acute pelvic inflammatory disease) 05/2017   Past Surgical History:  Procedure Laterality Date  . PATENT DUCTUS ARTERIOUS REPAIR     as an infant   Allergies  Allergen Reactions  . Flagyl [Metronidazole] Hives  . Other Hives    potatoes  . Penicillins Hives    Has patient had a PCN reaction causing immediate rash, facial/tongue/throat swelling, SOB or lightheadedness with hypotension: Yes Has patient had a PCN reaction causing severe rash involving mucus membranes or skin necrosis: No Has patient had a PCN reaction that required hospitalization No Has patient had a PCN reaction occurring within the last 10 years: No If all of the above  answers are "NO", then may proceed with Cephalosporin use.    No current facility-administered medications on file prior to encounter.    Current Outpatient Medications on File Prior to Encounter  Medication Sig Dispense Refill  . IRON PO Take by mouth.    . meloxicam (MOBIC) 7.5 MG tablet Take 1 tablet (7.5 mg total) by mouth daily. 15 tablet 0  . omeprazole (PRILOSEC) 20 MG capsule Take 1 capsule (20 mg total) by mouth daily. 90 capsule 3   Social History   Socioeconomic History  . Marital status: Single    Spouse name: Not on file  . Number of children: Not on file  . Years of education: Not on file  . Highest education level: Not on file  Occupational History  . 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 Occupation: call center  Social Needs  . Financial resource strain: Not on file  . Food insecurity:    Worry: Not on file    Inability: Not on file  . Transportation needs:    Medical: Not on file    Non-medical: Not on file  Tobacco Use  . Smoking status: Never Smoker  . Smokeless tobacco: Never Used  Substance and Sexual Activity  . Alcohol use: No  . Drug use: No  . Sexual activity: Yes    Partners: Male    Birth control/protection: None  Lifestyle  . Physical activity:    Days per week: 0 days    Minutes per session: Not on file  .  Stress: Not on file  Relationships  . Social connections:    Talks on phone: Not on file    Gets together: Not on file    Attends religious service: Not on file    Active member of club or organization: Not on file    Attends meetings of clubs or organizations: Not on file    Relationship status: Not on file  . Intimate partner violence:    Fear of current or ex partner: Not on file    Emotionally abused: Not on file    Physically abused: Not on file    Forced sexual activity: Not on file  Other Topics Concern  . Not on file  Social History Narrative   Lives with mother and sister.    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 Goal to open up own daycare.   Family History  Problem Relation Age of Onset  . Arthritis Mother   . Arthritis Maternal Grandmother   . Breast cancer Paternal Grandmother   . Down syndrome Sister   . Diabetes Maternal Uncle      OBJECTIVE:  Vitals:   05/18/18 1512 05/18/18 1513  BP:  123/74  Pulse: (!) 105   Resp: 16   Temp: 98.7 F (37.1 C)   SpO2: 100%     General appearance: AOx3 in no acute distress HEENT: NCAT.  Oropharynx clear.  Lungs: clear to auscultation bilaterally without adventitious breath sounds Heart: regular rate and rhythm.  Radial pulses 2+ symmetrical bilaterally Abdomen: soft, non-distended; normal active bowel sounds; non-tender to light and deep palpation; nontender at McBurney's point; negative Murphy's sign; negative rebound; no guarding GU: Declines.  Urine cytology obtained Back: no CVA tenderness Extremities: no edema; symmetrical with no gross deformities Skin: warm and dry Neurologic: CN 2-12 grossly intact; Strength and sensation intact about the UE and LE; finger to nose without difficulty; ambulates without difficulty Psychological: alert and cooperative; normal mood and affect  Labs: Results for orders placed or performed during the hospital encounter of 05/18/18 (from the past 24 hour(s))  Pregnancy, urine POC     Status: None   Collection Time: 05/18/18  3:33 PM  Result Value Ref Range   Preg Test, Ur NEGATIVE NEGATIVE  POCT urinalysis dip (device)     Status: Abnormal   Collection Time: 05/18/18  4:43 PM  Result Value Ref Range   Glucose, UA NEGATIVE NEGATIVE mg/dL   Bilirubin Urine SMALL (A) NEGATIVE   Ketones, ur 40 (A) NEGATIVE mg/dL   Specific Gravity, Urine 1.025 1.005 - 1.030   Hgb urine dipstick NEGATIVE NEGATIVE   pH 6.0 5.0 - 8.0   Protein, ur 30 (A) NEGATIVE mg/dL   Urobilinogen, UA 1.0 0.0 - 1.0 mg/dL   Nitrite NEGATIVE NEGATIVE   Leukocytes, UA NEGATIVE NEGATIVE       ASSESSMENT & PLAN:  1. Non-intractable vomiting, presence of nausea not specified, unspecified vomiting type   2. Nonintractable headache, unspecified chronicity pattern, unspecified headache type   3. Vaginal discharge     Meds ordered this encounter  Medications  . ondansetron (ZOFRAN) 4 MG tablet    Sig: Take 1 tablet (4 mg total) by mouth every 6 (six) hours as needed for nausea or vomiting.    Dispense:  12 tablet    Refill:  0    Order Specific Question:   Supervising Provider    Answer:   Isa Rankin [[id number]]      Urine  pregnancy negative Urine did not show signs of infection, but that you are dehydrated Continue to push fluids Zofran prescribed Urine cytology obtained.  WE will follow up with you regarding the results of your test.   Take medication as directed Follow up with PCP regarding further evaluation and management of chronic conditions like headache and abdominal pain  DIET Instructions: 30 minutes after taking nausea medicine, begin with sips of clear liquids. If able to hold down 2 - 4 ounces for 30 minutes, begin drinking more. Increase your fluid intake to replace losses. Clear liquids only for 24 hours (water, tea, sport drinks, clear flat ginger ale or cola and juices, broth, jello, popsicles, ect). Advance to bland foods, applesauce, rice, baked or boiled chicken, ect. Avoid milk, greasy foods and anything that doesn't agree with you. If symptoms persists follow up with PCP If you experience new or worsening symptoms return or go to ER If vomiting persists and you are unable to hold fluids, or if you develop high fever or abdominal pain, return here, see your doctor or go to the ER.   Declines toradol shot for HA  Return or go to the ER if you have any new or worsening symptoms  Reviewed expectations re: course of current medical issues. Questions answered. Outlined signs and symptoms indicating need for more acute  intervention. Patient verbalized understanding. After Visit Summary given.   Rennis HardingWurst, Haylynn Pha, PA-C 05/18/18 1706

## 2018-05-19 LAB — URINE CULTURE: Culture: <10000 — AB

## 2018-05-19 LAB — URINE CYTOLOGY ANCILLARY ONLY
Chlamydia: NEGATIVE
Neisseria Gonorrhea: NEGATIVE
TRICH (WINDOWPATH): NEGATIVE

## 2018-05-22 LAB — URINE CYTOLOGY ANCILLARY ONLY
BACTERIAL VAGINITIS: NEGATIVE
Candida vaginitis: NEGATIVE

## 2018-06-27 ENCOUNTER — Ambulatory Visit: Payer: Medicaid Other

## 2018-07-03 ENCOUNTER — Inpatient Hospital Stay (HOSPITAL_COMMUNITY)
Admission: AD | Admit: 2018-07-03 | Discharge: 2018-07-03 | Discharge status: Against Medical Advice | Payer: Medicaid Other | Source: Ambulatory Visit | Attending: Obstetrics and Gynecology | Admitting: Obstetrics and Gynecology

## 2018-07-03 DIAGNOSIS — Z5321 Procedure and treatment not carried out due to patient leaving prior to being seen by health care provider: Secondary | ICD-10-CM | POA: Insufficient documentation

## 2018-07-03 DIAGNOSIS — R112 Nausea with vomiting, unspecified: Secondary | ICD-10-CM | POA: Insufficient documentation

## 2018-07-03 LAB — URINALYSIS, ROUTINE W REFLEX MICROSCOPIC
Bilirubin Urine: NEGATIVE
Glucose, UA: NEGATIVE mg/dL
Hgb urine dipstick: NEGATIVE
Ketones, ur: NEGATIVE mg/dL
Leukocytes, UA: NEGATIVE
Nitrite: NEGATIVE
Protein, ur: NEGATIVE mg/dL
Specific Gravity, Urine: 1.025 (ref 1.005–1.030)
pH: 7 (ref 5.0–8.0)

## 2018-07-03 LAB — POCT PREGNANCY, URINE: PREG TEST UR: NEGATIVE

## 2018-07-03 NOTE — MAU Note (Signed)
Pt states she has been having n/v since March, also having dizziness.  Vomiting after sex. Pt states she's taken several HPT and all negative. Called the office and they said she may want to come here for a blood test.

## 2018-07-06 ENCOUNTER — Other Ambulatory Visit: Payer: Self-pay

## 2018-07-06 ENCOUNTER — Encounter: Payer: Self-pay | Admitting: Obstetrics

## 2018-07-06 ENCOUNTER — Ambulatory Visit (INDEPENDENT_AMBULATORY_CARE_PROVIDER_SITE_OTHER): Payer: Self-pay | Admitting: Obstetrics

## 2018-07-06 ENCOUNTER — Ambulatory Visit: Payer: Medicaid Other | Admitting: Obstetrics

## 2018-07-06 VITALS — BP 129/80 | HR 83 | Wt 140.0 lb

## 2018-07-06 DIAGNOSIS — R112 Nausea with vomiting, unspecified: Secondary | ICD-10-CM

## 2018-07-06 DIAGNOSIS — D508 Other iron deficiency anemias: Secondary | ICD-10-CM

## 2018-07-06 DIAGNOSIS — Z3202 Encounter for pregnancy test, result negative: Secondary | ICD-10-CM

## 2018-07-06 DIAGNOSIS — R300 Dysuria: Secondary | ICD-10-CM

## 2018-07-06 DIAGNOSIS — N898 Other specified noninflammatory disorders of vagina: Secondary | ICD-10-CM

## 2018-07-06 DIAGNOSIS — Z3009 Encounter for other general counseling and advice on contraception: Secondary | ICD-10-CM

## 2018-07-06 DIAGNOSIS — R350 Frequency of micturition: Secondary | ICD-10-CM

## 2018-07-06 LAB — POCT URINALYSIS DIPSTICK
Bilirubin, UA: NEGATIVE
Glucose, UA: NEGATIVE
Ketones, UA: NEGATIVE
Leukocytes, UA: NEGATIVE
Nitrite, UA: NEGATIVE
PH UA: 6.5 (ref 5.0–8.0)
PROTEIN UA: NEGATIVE
Spec Grav, UA: 1.015 (ref 1.010–1.025)
Urobilinogen, UA: 0.2 E.U./dL

## 2018-07-06 LAB — POCT URINE PREGNANCY: PREG TEST UR: NEGATIVE

## 2018-07-06 NOTE — Progress Notes (Signed)
Patient ID: Lori Moses, female   DOB: 1996-03-22, 22 y.o.   MRN: [id number]  Chief Complaint  Patient presents with  . Nausea    HPI Lori Moses is a 22 y.o. female.  Urinary frequency, nausea, vomiting and inability to keep anything down.  Has been seen at Urgent Care and was Rx Zofran and Prilosec with no relief of N /V.  Several urine cultures have been negative.  These symptolms have been present for ~ 4 months, with negative work-up thus far.  Pregnancy tests have been negative. HPI  Past Medical History:  Diagnosis Date  . Acid reflux   . H/O repair of patent ductus arteriosus   . Medical history non-contributory   . PID (acute pelvic inflammatory disease) 05/2017    Past Surgical History:  Procedure Laterality Date  . PATENT DUCTUS ARTERIOUS REPAIR     as an infant    Family History  Problem Relation Age of Onset  . Arthritis Mother   . Arthritis Maternal Grandmother   . Breast cancer Paternal Grandmother   . Down syndrome Sister   . Diabetes Maternal Uncle     Social History Social History   Tobacco Use  . Smoking status: Never Smoker  . Smokeless tobacco: Never Used  Substance Use Topics  . Alcohol use: No  . Drug use: No    Allergies  Allergen Reactions  . Flagyl [Metronidazole] Hives  . Other Hives    potatoes  . Penicillins Hives    Has patient had a PCN reaction causing immediate rash, facial/tongue/throat swelling, SOB or lightheadedness with hypotension: Yes Has patient had a PCN reaction causing severe rash involving mucus membranes or skin necrosis: No Has patient had a PCN reaction that required hospitalization No Has patient had a PCN reaction occurring within the last 10 years: No If all of the above answers are "NO", then may proceed with Cephalosporin use.     Current Outpatient Medications  Medication Sig Dispense Refill  . IRON PO Take by mouth.    . meloxicam (MOBIC) 7.5 MG tablet Take 1 tablet (7.5 mg total) by mouth  daily. 15 tablet 0  . omeprazole (PRILOSEC) 20 MG capsule Take 1 capsule (20 mg total) by mouth daily. 90 capsule 3  . ondansetron (ZOFRAN) 4 MG tablet Take 1 tablet (4 mg total) by mouth every 6 (six) hours as needed for nausea or vomiting. 12 tablet 0   No current facility-administered medications for this visit.     Review of Systems Review of Systems Constitutional: negative for fatigue and weight loss Respiratory: negative for cough and wheezing Cardiovascular: negative for chest pain, fatigue and palpitations Gastrointestinal: POSITIVE for nausea / vomiting Genitourinary:POSITIVE for urinary frequency Integument/breast: negative for nipple discharge Musculoskeletal:negative for myalgias Neurological: negative for gait problems and tremors Behavioral/Psych: negative for abusive relationship, depression Endocrine: negative for temperature intolerance      Blood pressure 129/80, pulse 83, weight 140 lb (63.5 kg), last menstrual period 07/06/2018.  Physical Exam Physical Exam:  Deferred >50% of 15 min visit spent on counseling and coordination of care.   Data Reviewed Urine Culture UPT  Assessment     1. Urinary frequency Rx: - POCT urinalysis dipstick - POCT urine pregnancy - Urine Culture  2. Vaginal discharge  3. Intractable vomiting with nausea, unspecified vomiting type  4. Birth control counseling - declines contraception  5. Iron deficiency anemia secondary to inadequate dietary iron intake Rx: - TSH - Comprehensive metabolic panel -  Ferritin - CBC    Plan    Follow up in 1 week to discuss lab results and management recommendations  Orders Placed This Encounter  Procedures  . Urine Culture  . TSH  . Comprehensive metabolic panel  . Ferritin  . CBC  . POCT urinalysis dipstick    Associate with Z13.89  . POCT urine pregnancy    Assciate with Z32.02 (negative pregnancy test). If positive, switch to Z32.01 (positive pregnancy test)   No  orders of the defined types were placed in this encounter.    Brock BadHARLES A. Cordelia Bessinger MD 07-06-2018

## 2018-07-07 LAB — TSH: TSH: 1.31 u[IU]/mL (ref 0.450–4.500)

## 2018-07-07 LAB — CBC
HEMATOCRIT: 38.8 % (ref 34.0–46.6)
HEMOGLOBIN: 12.2 g/dL (ref 11.1–15.9)
MCH: 25.9 pg — ABNORMAL LOW (ref 26.6–33.0)
MCHC: 31.4 g/dL — ABNORMAL LOW (ref 31.5–35.7)
MCV: 82 fL (ref 79–97)
Platelets: 173 10*3/uL (ref 150–450)
RBC: 4.71 x10E6/uL (ref 3.77–5.28)
RDW: 14.5 % (ref 12.3–15.4)
WBC: 4.3 10*3/uL (ref 3.4–10.8)

## 2018-07-07 LAB — COMPREHENSIVE METABOLIC PANEL
A/G RATIO: 1.7 (ref 1.2–2.2)
ALT: 9 IU/L (ref 0–32)
AST: 15 IU/L (ref 0–40)
Albumin: 4.5 g/dL (ref 3.5–5.5)
Alkaline Phosphatase: 56 IU/L (ref 39–117)
BILIRUBIN TOTAL: 0.6 mg/dL (ref 0.0–1.2)
BUN/Creatinine Ratio: 5 — ABNORMAL LOW (ref 9–23)
BUN: 4 mg/dL — ABNORMAL LOW (ref 6–20)
CALCIUM: 9.6 mg/dL (ref 8.7–10.2)
CHLORIDE: 108 mmol/L — AB (ref 96–106)
CO2: 24 mmol/L (ref 20–29)
Creatinine, Ser: 0.74 mg/dL (ref 0.57–1.00)
GFR, EST AFRICAN AMERICAN: 135 mL/min/{1.73_m2} (ref >59)
GFR, EST NON AFRICAN AMERICAN: 117 mL/min/{1.73_m2} (ref >59)
GLOBULIN, TOTAL: 2.7 g/dL (ref 1.5–4.5)
Glucose: 88 mg/dL (ref 65–99)
Potassium: 5.1 mmol/L (ref 3.5–5.2)
Sodium: 145 mmol/L — ABNORMAL HIGH (ref 134–144)
TOTAL PROTEIN: 7.2 g/dL (ref 6.0–8.5)

## 2018-07-07 LAB — FERRITIN: FERRITIN: 32 ng/mL (ref 15–150)

## 2018-07-08 LAB — URINE CULTURE

## 2018-07-18 ENCOUNTER — Telehealth: Payer: Self-pay

## 2018-07-18 NOTE — Telephone Encounter (Signed)
TC to pt regarding message patient did not answer LVMOM @ 1:37pm

## 2018-07-20 ENCOUNTER — Inpatient Hospital Stay (HOSPITAL_COMMUNITY)
Admission: AD | Admit: 2018-07-20 | Discharge: 2018-07-20 | Disposition: A | Discharge status: Home / Self Care | Payer: Medicaid Other | Source: Ambulatory Visit | Attending: Obstetrics & Gynecology | Admitting: Obstetrics & Gynecology

## 2018-07-20 ENCOUNTER — Encounter (HOSPITAL_COMMUNITY): Payer: Self-pay

## 2018-07-20 ENCOUNTER — Other Ambulatory Visit: Payer: Self-pay

## 2018-07-20 DIAGNOSIS — K219 Gastro-esophageal reflux disease without esophagitis: Secondary | ICD-10-CM

## 2018-07-20 DIAGNOSIS — Z881 Allergy status to other antibiotic agents status: Secondary | ICD-10-CM | POA: Insufficient documentation

## 2018-07-20 DIAGNOSIS — Z880 Allergy status to penicillin: Secondary | ICD-10-CM | POA: Insufficient documentation

## 2018-07-20 DIAGNOSIS — Z3202 Encounter for pregnancy test, result negative: Secondary | ICD-10-CM | POA: Insufficient documentation

## 2018-07-20 DIAGNOSIS — R110 Nausea: Secondary | ICD-10-CM | POA: Insufficient documentation

## 2018-07-20 LAB — URINALYSIS, ROUTINE W REFLEX MICROSCOPIC
BILIRUBIN URINE: NEGATIVE
GLUCOSE, UA: NEGATIVE mg/dL
HGB URINE DIPSTICK: NEGATIVE
Ketones, ur: 20 mg/dL — AB
Nitrite: NEGATIVE
PH: 5 (ref 5.0–8.0)
Protein, ur: NEGATIVE mg/dL
Specific Gravity, Urine: 1.025 (ref 1.005–1.030)

## 2018-07-20 LAB — CBC
HEMATOCRIT: 35.2 % — AB (ref 36.0–46.0)
HEMOGLOBIN: 11.9 g/dL — AB (ref 12.0–15.0)
MCH: 27.9 pg (ref 26.0–34.0)
MCHC: 33.8 g/dL (ref 30.0–36.0)
MCV: 82.4 fL (ref 78.0–100.0)
Platelets: 159 10*3/uL (ref 150–400)
RBC: 4.27 MIL/uL (ref 3.87–5.11)
RDW: 13.6 % (ref 11.5–15.5)
WBC: 4.5 10*3/uL (ref 4.0–10.5)

## 2018-07-20 LAB — HCG, QUANTITATIVE, PREGNANCY

## 2018-07-20 LAB — POCT PREGNANCY, URINE: PREG TEST UR: NEGATIVE

## 2018-07-20 MED ORDER — METOCLOPRAMIDE HCL 10 MG PO TABS: 10.0000 mg | ORAL_TABLET | Freq: Four times a day (QID) | ORAL | 0 refills | Status: DC

## 2018-07-20 NOTE — MAU Note (Signed)
Pt presents with c/o N/V,  dizziness, and headaches, and frequent urination. States was seen by PCP 3 weeks ago for same symptoms. Reports negative UPT @ MD office 3 weeks ago.  Denies VB or abdominal cramping.

## 2018-07-20 NOTE — MAU Provider Note (Signed)
History     CSN: [id number]  Arrival date and time: 07/20/18 1818   First Provider Initiated Contact with Patient 07/20/18 2000      Chief Complaint  Patient presents with  . Nausea  . Dizziness   HPI Lori Moses is a 22 y.o. G0P0000 non pregnant female who presents with nausea, vomiting, and dizziness. This has been ongoing for the last 6 months. She was seen and evaluated by Dr. Clearance CootsHarper at the beginning of August and the symptoms have not changed. She states she is only able to eat french fries without throwing up. She denies any vaginal bleeding, discharge, dysuria, constipation or diarrhea. Patient is concerned that she may be pregnant and her urine tests are wrong.   OB History    Gravida  0   Para  0   Term  0   Preterm  0   AB  0   Living  0     SAB  0   TAB  0   Ectopic  0   Multiple  0   Live Births              Past Medical History:  Diagnosis Date  . Acid reflux   . H/O repair of patent ductus arteriosus   . PID (acute pelvic inflammatory disease) 05/2017    Past Surgical History:  Procedure Laterality Date  . PATENT DUCTUS ARTERIOUS REPAIR     as an infant    Family History  Problem Relation Age of Onset  . Arthritis Mother   . Arthritis Maternal Grandmother   . Breast cancer Paternal Grandmother   . Down syndrome Sister   . Diabetes Maternal Uncle     Social History   Tobacco Use  . Smoking status: Never Smoker  . Smokeless tobacco: Never Used  Substance Use Topics  . Alcohol use: No  . Drug use: No    Allergies:  Allergies  Allergen Reactions  . Flagyl [Metronidazole] Hives  . Other Hives    potatoes  . Penicillins Hives    Has patient had a PCN reaction causing immediate rash, facial/tongue/throat swelling, SOB or lightheadedness with hypotension: Yes Has patient had a PCN reaction causing severe rash involving mucus membranes or skin necrosis: No Has patient had a PCN reaction that required hospitalization  No Has patient had a PCN reaction occurring within the last 10 years: No If all of the above answers are "NO", then may proceed with Cephalosporin use.     Medications Prior to Admission  Medication Sig Dispense Refill Last Dose  . IRON PO Take by mouth.     . meloxicam (MOBIC) 7.5 MG tablet Take 1 tablet (7.5 mg total) by mouth daily. 15 tablet 0   . omeprazole (PRILOSEC) 20 MG capsule Take 1 capsule (20 mg total) by mouth daily. 90 capsule 3   . ondansetron (ZOFRAN) 4 MG tablet Take 1 tablet (4 mg total) by mouth every 6 (six) hours as needed for nausea or vomiting. 12 tablet 0     Review of Systems  Constitutional: Negative.  Negative for fatigue and fever.  HENT: Negative.   Respiratory: Negative.  Negative for shortness of breath.   Cardiovascular: Negative.  Negative for chest pain.  Gastrointestinal: Positive for nausea and vomiting. Negative for abdominal pain, constipation and diarrhea.  Genitourinary: Negative.  Negative for dysuria.  Neurological: Positive for dizziness. Negative for headaches.   Physical Exam   Blood pressure 128/82, pulse 99, temperature  98.7 F (37.1 C), temperature source Oral, resp. rate 18, height 5\' 7"  (1.702 m), weight 63.2 kg, last menstrual period 07/06/2018, SpO2 99 %.  Physical Exam  Nursing note and vitals reviewed. Constitutional: She is oriented to person, place, and time. She appears well-developed and well-nourished. No distress.  HENT:  Head: Normocephalic.  Eyes: Pupils are equal, round, and reactive to light.  Cardiovascular: Normal rate, regular rhythm and normal heart sounds.  Respiratory: Effort normal and breath sounds normal. No respiratory distress.  GI: Soft. Bowel sounds are normal. She exhibits no distension. There is no tenderness.  Neurological: She is alert and oriented to person, place, and time.  Skin: Skin is warm and dry.  Psychiatric: She has a normal mood and affect. Her behavior is normal. Judgment and thought  content normal.    MAU Course  Procedures Results for orders placed or performed during the hospital encounter of 07/20/18 (from the past 24 hour(s))  Urinalysis, Routine w reflex microscopic     Status: Abnormal   Collection Time: 07/20/18  6:44 PM  Result Value Ref Range   Color, Urine YELLOW YELLOW   APPearance HAZY (A) CLEAR   Specific Gravity, Urine 1.025 1.005 - 1.030   pH 5.0 5.0 - 8.0   Glucose, UA NEGATIVE NEGATIVE mg/dL   Hgb urine dipstick NEGATIVE NEGATIVE   Bilirubin Urine NEGATIVE NEGATIVE   Ketones, ur 20 (A) NEGATIVE mg/dL   Protein, ur NEGATIVE NEGATIVE mg/dL   Nitrite NEGATIVE NEGATIVE   Leukocytes, UA SMALL (A) NEGATIVE   RBC / HPF 0-5 0 - 5 RBC/hpf   WBC, UA 6-10 0 - 5 WBC/hpf   Bacteria, UA RARE (A) NONE SEEN   Squamous Epithelial / LPF 11-20 0 - 5   Mucus PRESENT   Pregnancy, urine POC     Status: None   Collection Time: 07/20/18  6:51 PM  Result Value Ref Range   Preg Test, Ur NEGATIVE NEGATIVE  CBC     Status: Abnormal   Collection Time: 07/20/18  8:28 PM  Result Value Ref Range   WBC 4.5 4.0 - 10.5 K/uL   RBC 4.27 3.87 - 5.11 MIL/uL   Hemoglobin 11.9 (L) 12.0 - 15.0 g/dL   HCT 78.235.2 (L) 95.636.0 - 21.346.0 %   MCV 82.4 78.0 - 100.0 fL   MCH 27.9 26.0 - 34.0 pg   MCHC 33.8 30.0 - 36.0 g/dL   RDW 08.613.6 57.811.5 - 46.915.5 %   Platelets 159 150 - 400 K/uL  hCG, quantitative, pregnancy     Status: None   Collection Time: 07/20/18  8:28 PM  Result Value Ref Range   hCG, Beta Chain, Quant, S <1 <5 mIU/mL   MDM UA, UPT CBC, HCG  No weight loss from previous visits. Patient requests new RX for nausea  Assessment and Plan   1. Gastroesophageal reflux disease without esophagitis   2. Pregnancy examination or test, negative result   3. Nausea    -Discharge home in stable condition -Rx for reglan given to patient. Encouraged patient to increase omeprazole to BID -Patient advised to follow-up with PCP for management of ongoing symptoms -Patient may return to MAU  as needed or if her condition were to change or worsen   Rolm BookbinderCaroline M Neill CNM 07/20/2018, 8:00 PM

## 2018-07-20 NOTE — Discharge Instructions (Signed)
Gastroesophageal Reflux Disease, Adult Normally, food travels down the esophagus and stays in the stomach to be digested. If a person has gastroesophageal reflux disease (GERD), food and stomach acid move back up into the esophagus. When this happens, the esophagus becomes sore and swollen (inflamed). Over time, GERD can make small holes (ulcers) in the lining of the esophagus. Follow these instructions at home: Diet  Follow a diet as told by your doctor. You may need to avoid foods and drinks such as: ? Coffee and tea (with or without caffeine). ? Drinks that contain alcohol. ? Energy drinks and sports drinks. ? Carbonated drinks or sodas. ? Chocolate and cocoa. ? Peppermint and mint flavorings. ? Garlic and onions. ? Horseradish. ? Spicy and acidic foods, such as peppers, chili powder, curry powder, vinegar, hot sauces, and BBQ sauce. ? Citrus fruit juices and citrus fruits, such as oranges, lemons, and limes. ? Tomato-based foods, such as red sauce, chili, salsa, and pizza with red sauce. ? Fried and fatty foods, such as donuts, french fries, potato chips, and high-fat dressings. ? High-fat meats, such as hot dogs, rib eye steak, sausage, ham, and bacon. ? High-fat dairy items, such as whole milk, butter, and cream cheese.  Eat small meals often. Avoid eating large meals.  Avoid drinking large amounts of liquid with your meals.  Avoid eating meals during the 2-3 hours before bedtime.  Avoid lying down right after you eat.  Do not exercise right after you eat. General instructions  Pay attention to any changes in your symptoms.  Take over-the-counter and prescription medicines only as told by your doctor. Do not take aspirin, ibuprofen, or other NSAIDs unless your doctor says it is okay.  Do not use any tobacco products, including cigarettes, chewing tobacco, and e-cigarettes. If you need help quitting, ask your doctor.  Wear loose clothes. Do not wear anything tight around  your waist.  Raise (elevate) the head of your bed about 6 inches (15 cm).  Try to lower your stress. If you need help doing this, ask your doctor.  If you are overweight, lose an amount of weight that is healthy for you. Ask your doctor about a safe weight loss goal.  Keep all follow-up visits as told by your doctor. This is important. Contact a doctor if:  You have new symptoms.  You lose weight and you do not know why it is happening.  You have trouble swallowing, or it hurts to swallow.  You have wheezing or a cough that keeps happening.  Your symptoms do not get better with treatment.  You have a hoarse voice. Get help right away if:  You have pain in your arms, neck, jaw, teeth, or back.  You feel sweaty, dizzy, or light-headed.  You have chest pain or shortness of breath.  You throw up (vomit) and your throw up looks like blood or coffee grounds.  You pass out (faint).  Your poop (stool) is bloody or black.  You cannot swallow, drink, or eat. This information is not intended to replace advice given to you by your health care provider. Make sure you discuss any questions you have with your health care provider. Document Released: 05/10/2008 Document Revised: 04/29/2016 Document Reviewed: 03/19/2015 Elsevier Interactive Patient Education  2018 [employer] Inc[employer]   Food Choices for Gastroesophageal Reflux Disease, Adult When you have gastroesophageal reflux disease (GERD), the foods you eat and your eating habits are very important. Choosing the right foods can help ease your discomfort. What   guidelines do I need to follow?  Choose fruits, vegetables, whole grains, and low-fat dairy products.  Choose low-fat meat, fish, and poultry.  Limit fats such as oils, salad dressings, butter, nuts, and avocado.  Keep a food diary. This helps you identify foods that cause symptoms.  Avoid foods that cause symptoms. These may be different for everyone.  Eat small meals  often instead of 3 large meals a day.  Eat your meals slowly, in a place where you are relaxed.  Limit fried foods.  Cook foods using methods other than frying.  Avoid drinking alcohol.  Avoid drinking large amounts of liquids with your meals.  Avoid bending over or lying down until 2-3 hours after eating. What foods are not recommended? These are some foods and drinks that may make your symptoms worse: Vegetables Tomatoes. Tomato juice. Tomato and spaghetti sauce. Chili peppers. Onion and garlic. Horseradish. Fruits Oranges, grapefruit, and lemon (fruit and juice). Meats High-fat meats, fish, and poultry. This includes hot dogs, ribs, ham, sausage, salami, and bacon. Dairy Whole milk and chocolate milk. Sour cream. Cream. Butter. Ice cream. Cream cheese. Drinks Coffee and tea. Bubbly (carbonated) drinks or energy drinks. Condiments Hot sauce. Barbecue sauce. Sweets/Desserts Chocolate and cocoa. Donuts. Peppermint and spearmint. Fats and Oils High-fat foods. This includes French fries and potato chips. Other Vinegar. Strong spices. This includes black pepper, white pepper, red pepper, cayenne, curry powder, cloves, ginger, and chili powder. The items listed above may not be a complete list of foods and drinks to avoid. Contact your dietitian for more information. This information is not intended to replace advice given to you by your health care provider. Make sure you discuss any questions you have with your health care provider. Document Released: 05/23/2012 Document Revised: 04/29/2016 Document Reviewed: 09/26/2013 Elsevier Interactive Patient Education  2017 [employer]vier Inc.  

## 2018-07-20 NOTE — MAU Note (Signed)
Urine in lab - small amount of urine - culture tube not sent

## 2018-11-13 ENCOUNTER — Ambulatory Visit: Payer: Medicaid Other | Admitting: Obstetrics

## 2018-11-14 ENCOUNTER — Ambulatory Visit: Payer: Medicaid Other | Admitting: *Deleted

## 2018-11-14 ENCOUNTER — Other Ambulatory Visit: Payer: Self-pay

## 2018-11-14 ENCOUNTER — Other Ambulatory Visit (HOSPITAL_COMMUNITY)
Admission: RE | Admit: 2018-11-14 | Discharge: 2018-11-14 | Disposition: A | Discharge status: Home / Self Care | Payer: Medicaid Other | Source: Ambulatory Visit | Attending: Obstetrics and Gynecology | Admitting: Obstetrics and Gynecology

## 2018-11-14 VITALS — BP 112/69 | HR 74 | Temp 98.2°F | Ht 67.0 in | Wt 131.0 lb

## 2018-11-14 DIAGNOSIS — R350 Frequency of micturition: Secondary | ICD-10-CM

## 2018-11-14 DIAGNOSIS — N898 Other specified noninflammatory disorders of vagina: Secondary | ICD-10-CM | POA: Insufficient documentation

## 2018-11-14 LAB — POCT URINALYSIS DIPSTICK (MANUAL)
Nitrite, UA: NEGATIVE
PH UA: 7 (ref 5.0–8.0)
POCT BILIRUBIN: NEGATIVE
POCT BLOOD: NEGATIVE
POCT PROTEIN: NEGATIVE mg/dL
Poct Glucose: NORMAL mg/dL
Poct Ketones: NEGATIVE
Poct Urobilinogen: NORMAL mg/dL
SPEC GRAV UA: 1.02 (ref 1.010–1.025)

## 2018-11-14 NOTE — Progress Notes (Signed)
   SUBJECTIVE:  22 y.o. female complains of thick and dark yellow vaginal discharge, odor and itching for 2 week(s). Denies abnormal vaginal bleeding or significant pelvic pain or fever.  Urinary frequency. Denies history of known exposure to STD.  Last sexual intercourse was last month.  Patient's last menstrual period was 11/04/2018.  OBJECTIVE:  She appears well, afebrile. Urine dipstick: positive for leukocytes (small +1).  ASSESSMENT:  Vaginal Discharge  Vaginal Odor   PLAN:  GC, chlamydia, trichomonas, BVAG, CVAG probe sent to lab. Urine culture sent to lab. Treatment: To be determined once lab results are received ROV prn if symptoms persist or worsen.   Clovis PuMartin, Ahad Colarusso L, RN

## 2018-11-16 LAB — URINE CULTURE: ORGANISM ID, BACTERIA: NO GROWTH

## 2018-11-17 LAB — CERVICOVAGINAL ANCILLARY ONLY
BACTERIAL VAGINITIS: POSITIVE — AB
CANDIDA VAGINITIS: NEGATIVE
Chlamydia: NEGATIVE
NEISSERIA GONORRHEA: NEGATIVE
Trichomonas: NEGATIVE

## 2018-11-18 ENCOUNTER — Other Ambulatory Visit: Payer: Self-pay | Admitting: Obstetrics and Gynecology

## 2018-11-18 DIAGNOSIS — B9689 Other specified bacterial agents as the cause of diseases classified elsewhere: Secondary | ICD-10-CM

## 2018-11-18 DIAGNOSIS — N760 Acute vaginitis: Principal | ICD-10-CM

## 2018-11-18 MED ORDER — CLINDAMYCIN PHOSPHATE 1 % EX GEL: Freq: Two times a day (BID) | CUTANEOUS | 0 refills | Status: DC

## 2018-11-18 NOTE — Progress Notes (Signed)
My Chart message sent to notify of test result and Rx sent to pharmacy on file.  Raelyn Moraolitta Deeandra Jerry, CNM

## 2018-11-20 ENCOUNTER — Telehealth: Payer: Self-pay | Admitting: General Practice

## 2018-11-20 DIAGNOSIS — B9689 Other specified bacterial agents as the cause of diseases classified elsewhere: Secondary | ICD-10-CM

## 2018-11-20 DIAGNOSIS — N760 Acute vaginitis: Principal | ICD-10-CM

## 2018-11-20 MED ORDER — CLINDAMYCIN PHOSPHATE 1 % EX GEL: CUTANEOUS | 0 refills | Status: DC

## 2018-11-20 NOTE — Addendum Note (Signed)
Addended by: Clovis PuMARTIN, TAMIKA L on: 11/20/2018 12:43 PM   Modules accepted: Orders

## 2018-11-20 NOTE — Telephone Encounter (Signed)
[facility] called and left message on nurse voicemail line stating they need clarification on the clindamycin Rx that was sent in. They state the directions state two different things with two different routes. Please call back to clarify.

## 2018-11-20 NOTE — Telephone Encounter (Signed)
[facility] to 2% vaginally.  Order given.  Clovis PuMartin, Tamika L, RN

## 2018-11-20 NOTE — Telephone Encounter (Signed)
Prescription resent with correct direction. Clovis PuMartin, Mashonda Broski L, RN

## 2018-12-06 NOTE — L&D Delivery Note (Signed)
Delivery Note At 11:07 AM a viable female was delivered via Vaginal, Spontaneous delivery by Dr. Sable Feil as Dr. Alwyn Pea who is on cal was in the operating room and I was called to come emergently but did not make delivery. (Presentation: LOA ).  APGAR: 9, 9; weight  pending.   Placenta status: spont intact. complications: none.  Cord pH: n/a  Anesthesia: no epidural, s/p dose of fentanyl  Episiotomy: None Lacerations: Periurethral;2nd degree;Perineal Suture Repair: 2.0 vicryl and 4-0 vicryl Est. Blood Loss (mL):  417 cc  Mom to postpartum.  Baby to Couplet care / Skin to Skin.  Delice Lesch 10/18/2019, 11:42 AM

## 2018-12-06 NOTE — L&D Delivery Note (Addendum)
OB/GYN Faculty Practice Delivery Note  Lori Moses is a 23 y.o. G1P0000 s/p Vaginal Delivery at [redacted]w[redacted]d. She was admitted for IOL for post dates.   ROM: 0h 16m with clear fluid GBS Status: treated as positive 2/2 GBS in urine in 1st trimester Maximum Maternal Temperature: 98.58F  Delivery Date/Time: 10/18/19, 1107 hours Delivery: Called to room and patient was complete and pushing. AROM was performed with clear fluid, as she had a bag bulging out of the vagina with pushing. Head delivered LOA. No nuchal cord present. Shoulder and body delivered in usual fashion. Infant with spontaneous cry, placed on mother's abdomen, dried and stimulated. Cord clamped x 2 after 1-minute delay, and cut by grandmother of the baby under my direct supervision. Cord blood drawn.   Dr. Mancel Bale arrived after delivery of the baby and assumed patient care.  [name], DO OB/GYN Fellow, Faculty Practice

## 2019-01-17 ENCOUNTER — Encounter: Payer: Self-pay | Admitting: Advanced Practice Midwife

## 2019-01-17 ENCOUNTER — Ambulatory Visit (INDEPENDENT_AMBULATORY_CARE_PROVIDER_SITE_OTHER): Payer: Medicaid Other | Admitting: Advanced Practice Midwife

## 2019-01-17 ENCOUNTER — Other Ambulatory Visit (HOSPITAL_COMMUNITY)
Admission: RE | Admit: 2019-01-17 | Discharge: 2019-01-17 | Disposition: A | Discharge status: Home / Self Care | Payer: Medicaid Other | Source: Ambulatory Visit | Attending: Advanced Practice Midwife | Admitting: Advanced Practice Midwife

## 2019-01-17 ENCOUNTER — Other Ambulatory Visit: Payer: Self-pay

## 2019-01-17 VITALS — BP 108/69 | HR 69 | Temp 97.5°F | Ht 67.0 in | Wt 129.0 lb

## 2019-01-17 DIAGNOSIS — N898 Other specified noninflammatory disorders of vagina: Secondary | ICD-10-CM

## 2019-01-17 DIAGNOSIS — Z0000 Encounter for general adult medical examination without abnormal findings: Secondary | ICD-10-CM | POA: Diagnosis not present

## 2019-01-17 DIAGNOSIS — Z113 Encounter for screening for infections with a predominantly sexual mode of transmission: Secondary | ICD-10-CM | POA: Insufficient documentation

## 2019-01-17 DIAGNOSIS — Z114 Encounter for screening for human immunodeficiency virus [HIV]: Secondary | ICD-10-CM

## 2019-01-17 DIAGNOSIS — N3942 Incontinence without sensory awareness: Secondary | ICD-10-CM

## 2019-01-17 NOTE — Progress Notes (Signed)
GYNECOLOGY ANNUAL PREVENTATIVE CARE ENCOUNTER NOTE  Subjective:   Aleda Granaylisha S Nemmers is a 23 y.o. G0P0000 female here for a routine annual gynecologic exam and is requesting STD testing.  Current complaints: thinks she may have BV again. Has recurrent BV, but has had problems with oral and vaginal Metronidazole and clindamycin either causing allergic reactions, yeast infections or not working. Has had improvement in Sx when using probiotics and plans to try them again.   Also reports new-onset leaking of urine.  Denies abnormal vaginal bleeding, abd pain, pelvic pain, problems with intercourse or other gynecologic concerns.   Leaking of urine started recently. Has been having numbness down one leg. Has been going to a Chiropractor for adjustments. Started leaking urine w/out any sensation of needing to urinate and not w/ stress. Denies dysuria, frequency, hematuria.    Gynecologic History Patient's last menstrual period was 01/05/2019 (exact date). Contraception: condoms Last Pap: NA, <21  Obstetric History OB History  Gravida Para Term Preterm AB Living  0 0 0 0 0 0  SAB TAB Ectopic Multiple Live Births  0 0 0 0      Past Medical History:  Diagnosis Date  . Acid reflux   . H/O repair of patent ductus arteriosus   . PID (acute pelvic inflammatory disease) 05/2017    Past Surgical History:  Procedure Laterality Date  . PATENT DUCTUS ARTERIOUS REPAIR     as an infant    No current outpatient medications on file prior to visit.   No current facility-administered medications on file prior to visit.     Allergies  Allergen Reactions  . Flagyl [Metronidazole] Hives  . Other Hives    potatoes  . Penicillins Hives    Has patient had a PCN reaction causing immediate rash, facial/tongue/throat swelling, SOB or lightheadedness with hypotension: Yes Has patient had a PCN reaction causing severe rash involving mucus membranes or skin necrosis: No Has patient had a PCN reaction  that required hospitalization No Has patient had a PCN reaction occurring within the last 10 years: No If all of the above answers are "NO", then may proceed with Cephalosporin use.     Social History:  reports that she has never smoked. She has never used smokeless tobacco. She reports that she does not drink alcohol or use drugs.  Family History  Problem Relation Age of Onset  . Arthritis Mother   . Arthritis Maternal Grandmother   . Breast cancer Paternal Grandmother   . Down syndrome Sister   . Diabetes Maternal Uncle     The following portions of the patient's history were reviewed and updated as appropriate: allergies, current medications, past family history, past medical history, past social history, past surgical history and problem list.  Review of Systems Pos for vaginal odor. Pertinent items noted in HPI and remainder of comprehensive ROS otherwise negative.   Objective:  BP 108/69 (BP Location: Right Arm, Patient Position: Sitting, Cuff Size: Normal)   Pulse 69   Temp (!) 97.5 F (36.4 C) (Oral)   Ht 5\' 7"  (1.702 m)   Wt 129 lb (58.5 kg)   LMP 01/05/2019 (Exact Date)   BMI 20.20 kg/m  CONSTITUTIONAL: Well-developed, well-nourished female in no acute distress.  HENT:  Normocephalic, atraumatic EYES: Conjunctivae and EOM are normal. No scleral icterus.  SKIN: Skin is warm and dry. No rash noted. Not diaphoretic. No erythema. No pallor. MUSCULOSKELETAL: Normal range of motion.  NEUROLOGIC: Alert and oriented to person, place,  and time.  PSYCHIATRIC: Normal mood and affect. Normal behavior. Normal judgment and thought content. CARDIOVASCULAR: Normal rate RESPIRATORY:  Normal Effort and rate BREASTS: Symmetric in size. No masses, skin changes, nipple drainage, or lymphadenopathy. ABDOMEN: Soft, normal bowel sounds, no distention noted.  No tenderness, rebound or guarding.  PELVIC: Normal appearing external genitalia; normal appearing vaginal mucosa and cervix.  No  abnormal discharge noted.  Pap smear not indicated due to age <67.  Normal uterine size, no other palpable masses, no uterine or adnexal tenderness.    Assessment and Plan:  1. Urinary incontinence without sensory awareness--Not C/W stress or urge UI.  - Ambulatory referral to [facility]  2. Vaginal odor  - Cervicovaginal ancillary only( [place])  3. Vaginal discharge  - Cervicovaginal ancillary only( [place]) - Wants to try probiotics again if + BV - Discussed that using condoms may help improve Tx success. - May want to consider taking Diflucan w/ Clinda or Metrogel if probiotics aren't working  4. Encounter for screening for HIV  - HIV antibody (with reflex)  5. Screening examination for STD (sexually transmitted disease)  - Cervicovaginal ancillary only( ) - RPR - Hepatitis B surface antigen - Hepatitis C Antibody  Will follow up results and manage accordingly. Routine preventative health maintenance measures emphasized. Please refer to After Visit Summary for other counseling recommendations.    Katrinka Blazing, [place], CNM 01/17/2019 2:40 PM

## 2019-01-18 LAB — CERVICOVAGINAL ANCILLARY ONLY
Bacterial vaginitis: POSITIVE — AB
CANDIDA VAGINITIS: NEGATIVE
Chlamydia: NEGATIVE
Neisseria Gonorrhea: NEGATIVE
Trichomonas: NEGATIVE

## 2019-01-22 NOTE — Patient Instructions (Signed)

## 2019-02-05 ENCOUNTER — Ambulatory Visit (INDEPENDENT_AMBULATORY_CARE_PROVIDER_SITE_OTHER): Payer: Medicaid Other | Admitting: Primary Care

## 2019-02-10 ENCOUNTER — Ambulatory Visit (HOSPITAL_COMMUNITY)
Admission: EM | Admit: 2019-02-10 | Discharge: 2019-02-10 | Disposition: A | Discharge status: Home / Self Care | Payer: Medicaid Other | Attending: Internal Medicine | Admitting: Internal Medicine

## 2019-02-10 ENCOUNTER — Encounter (HOSPITAL_COMMUNITY): Payer: Self-pay

## 2019-02-10 DIAGNOSIS — Z3201 Encounter for pregnancy test, result positive: Secondary | ICD-10-CM

## 2019-02-10 DIAGNOSIS — Z3490 Encounter for supervision of normal pregnancy, unspecified, unspecified trimester: Secondary | ICD-10-CM

## 2019-02-10 LAB — POCT PREGNANCY, URINE: PREG TEST UR: POSITIVE — AB

## 2019-02-10 MED ORDER — PRENATAL COMPLETE 14-0.4 MG PO TABS: 1.0000 | ORAL_TABLET | Freq: Every day | ORAL | 3 refills | Status: DC

## 2019-02-10 NOTE — Discharge Instructions (Addendum)
Patient on the estimation of your last period being January 29 your estimated due date will be September 12, 2019.

## 2019-02-10 NOTE — ED Provider Notes (Signed)
[facility]    CSN: [id number] Arrival date & time: 02/10/19  1109     History   Chief Complaint Chief Complaint  Patient presents with  . Possible Pregnancy    HPI Lori Moses is a 23 y.o. female.   23 year old female presents for confirmation of possible pregnancy.  Patient states that she took 3 pregnancy tests at home which were all positive however she wanted confirmation at this time.     Past Medical History:  Diagnosis Date  . Acid reflux   . H/O repair of patent ductus arteriosus   . PID (acute pelvic inflammatory disease) 05/2017    Patient Active Problem List   Diagnosis Date Noted  . Urinary frequency 04/24/2018  . Nausea and vomiting 03/13/2018  . Acid reflux 03/13/2018  . General counseling and advice on female contraception 03/13/2018  . Pelvic pain 11/04/2017  . Vaginal discharge 11/04/2017  . Chlamydia 09/20/2017    Past Surgical History:  Procedure Laterality Date  . PATENT DUCTUS ARTERIOUS REPAIR     as an infant    OB History    Gravida  0   Para  0   Term  0   Preterm  0   AB  0   Living  0     SAB  0   TAB  0   Ectopic  0   Multiple  0   Live Births               Home Medications    Prior to Admission medications   Medication Sig Start Date End Date Taking? Authorizing Provider  Prenatal Vit-Fe Fumarate-FA (PRENATAL COMPLETE) 14-0.4 MG TABS Take 1 tablet by mouth daily. 02/10/19   Alene Mires, NP    Family History Family History  Problem Relation Age of Onset  . Arthritis Mother   . Arthritis Maternal Grandmother   . Breast cancer Paternal Grandmother   . Down syndrome Sister   . Diabetes Maternal Uncle     Social History Social History   Tobacco Use  . Smoking status: Never Smoker  . Smokeless tobacco: Never Used  Substance Use Topics  . Alcohol use: No  . Drug use: No     Allergies   Flagyl [metronidazole]; Other; and Penicillins   Review of Systems Review of  Systems  Constitutional: Negative for chills and fever.  HENT: Negative for ear pain and sore throat.   Eyes: Negative for pain and visual disturbance.  Respiratory: Negative for cough and shortness of breath.   Cardiovascular: Negative for chest pain and palpitations.  Gastrointestinal: Negative for abdominal pain and vomiting.  Genitourinary: Negative for dysuria and hematuria.  Musculoskeletal: Negative for arthralgias and back pain.  Skin: Negative for color change and rash.  Neurological: Negative for seizures and syncope.  All other systems reviewed and are negative.    Physical Exam Triage Vital Signs ED Triage Vitals [02/10/19 1214]  Enc Vitals Group     BP 122/68     Pulse Rate 80     Resp 16     Temp 98.4 F (36.9 C)     Temp Source Oral     SpO2 99 %     Weight      Height      Head Circumference      Peak Flow      Pain Score 0     Pain Loc      Pain Edu?  Excl. in GC?    No data found.  Updated Vital Signs BP 122/68 (BP Location: Right Arm)   Pulse 80   Temp 98.4 F (36.9 C) (Oral)   Resp 16   LMP 01/03/2019   SpO2 99%      Physical Exam Vitals signs and nursing note reviewed.  Constitutional:      Appearance: She is well-developed.  HENT:     Head: Normocephalic and atraumatic.  Eyes:     Conjunctiva/sclera: Conjunctivae normal.  Neck:     Musculoskeletal: Normal range of motion.  Pulmonary:     Effort: Pulmonary effort is normal.  Musculoskeletal: Normal range of motion.  Skin:    General: Skin is warm.  Neurological:     Mental Status: She is alert and oriented to person, place, and time.      UC Treatments / Results  Labs (all labs ordered are listed, but only abnormal results are displayed) Labs Reviewed  POCT PREGNANCY, URINE - Abnormal; Notable for the following components:      Result Value   Preg Test, Ur POSITIVE (*)    All other components within normal limits    EKG None  Radiology No results  found.  Procedures Procedures (including critical care time)  Medications Ordered in UC Medications - No data to display  Initial Impression / Assessment and Plan / UC Course  I have reviewed the triage vital signs and the nursing notes.  Pertinent labs & imaging results that were available during my care of the patient were reviewed by me and considered in my medical decision making (see chart for details).      Final Clinical Impressions(s) / UC Diagnoses   Final diagnoses:  Pregnancy, unspecified gestational age   Discharge Instructions   None    ED Prescriptions    Medication Sig Dispense Auth. Provider   Prenatal Vit-Fe Fumarate-FA (PRENATAL COMPLETE) 14-0.4 MG TABS Take 1 tablet by mouth daily. 60 each Alene Mires, NP     Controlled Substance Prescriptions [place] Controlled Substance Registry consulted? Not Applicable   Alene Mires, NP 02/10/19 1249

## 2019-02-10 NOTE — ED Triage Notes (Signed)
Pt C/O a pregnancy test, she took 3 test at home.

## 2019-02-15 ENCOUNTER — Encounter (HOSPITAL_COMMUNITY): Payer: Self-pay | Admitting: *Deleted

## 2019-02-15 ENCOUNTER — Inpatient Hospital Stay (HOSPITAL_COMMUNITY): Payer: Medicaid Other

## 2019-02-15 ENCOUNTER — Emergency Department (HOSPITAL_COMMUNITY): Admission: EM | Admit: 2019-02-15 | Discharge: 2019-02-15 | Discharge status: Against Medical Advice | Payer: Self-pay

## 2019-02-15 ENCOUNTER — Inpatient Hospital Stay (HOSPITAL_COMMUNITY)
Admission: EM | Admit: 2019-02-15 | Discharge: 2019-02-15 | Disposition: A | Discharge status: Home / Self Care | Payer: Medicaid Other | Attending: Obstetrics and Gynecology | Admitting: Obstetrics and Gynecology

## 2019-02-15 DIAGNOSIS — M549 Dorsalgia, unspecified: Secondary | ICD-10-CM | POA: Diagnosis present

## 2019-02-15 DIAGNOSIS — O208 Other hemorrhage in early pregnancy: Secondary | ICD-10-CM | POA: Diagnosis not present

## 2019-02-15 DIAGNOSIS — Z3490 Encounter for supervision of normal pregnancy, unspecified, unspecified trimester: Secondary | ICD-10-CM

## 2019-02-15 DIAGNOSIS — Z880 Allergy status to penicillin: Secondary | ICD-10-CM | POA: Diagnosis not present

## 2019-02-15 DIAGNOSIS — Z3A01 Less than 8 weeks gestation of pregnancy: Secondary | ICD-10-CM | POA: Diagnosis not present

## 2019-02-15 DIAGNOSIS — Z6790 Unspecified blood type, Rh positive: Secondary | ICD-10-CM

## 2019-02-15 DIAGNOSIS — O209 Hemorrhage in early pregnancy, unspecified: Secondary | ICD-10-CM

## 2019-02-15 LAB — WET PREP, GENITAL
Clue Cells Wet Prep HPF POC: NONE SEEN
Trich, Wet Prep: NONE SEEN
YEAST WET PREP: NONE SEEN

## 2019-02-15 LAB — URINALYSIS, ROUTINE W REFLEX MICROSCOPIC
BILIRUBIN URINE: NEGATIVE
Glucose, UA: NEGATIVE mg/dL
HGB URINE DIPSTICK: NEGATIVE
Ketones, ur: 5 mg/dL — AB
NITRITE: NEGATIVE
PH: 7 (ref 5.0–8.0)
Protein, ur: NEGATIVE mg/dL
SPECIFIC GRAVITY, URINE: 1.017 (ref 1.005–1.030)
WBC, UA: >50 WBC/hpf — ABNORMAL HIGH (ref 0–5)

## 2019-02-15 LAB — COMPREHENSIVE METABOLIC PANEL
ALBUMIN: 4.1 g/dL (ref 3.5–5.0)
ALT: 13 U/L (ref 0–44)
AST: 17 U/L (ref 15–41)
Alkaline Phosphatase: 48 U/L (ref 38–126)
Anion gap: 5 (ref 5–15)
BUN: 7 mg/dL (ref 6–20)
CHLORIDE: 106 mmol/L (ref 98–111)
CO2: 25 mmol/L (ref 22–32)
Calcium: 9.3 mg/dL (ref 8.9–10.3)
Creatinine, Ser: 0.62 mg/dL (ref 0.44–1.00)
GFR calc Af Amer: >60 mL/min (ref >60)
Glucose, Bld: 79 mg/dL (ref 70–99)
POTASSIUM: 3.9 mmol/L (ref 3.5–5.1)
Sodium: 136 mmol/L (ref 135–145)
Total Bilirubin: 1 mg/dL (ref 0.3–1.2)
Total Protein: 7.2 g/dL (ref 6.5–8.1)

## 2019-02-15 LAB — CBC
HEMATOCRIT: 37.3 % (ref 36.0–46.0)
Hemoglobin: 12 g/dL (ref 12.0–15.0)
MCH: 26.7 pg (ref 26.0–34.0)
MCHC: 32.2 g/dL (ref 30.0–36.0)
MCV: 83.1 fL (ref 80.0–100.0)
Platelets: 160 10*3/uL (ref 150–400)
RBC: 4.49 MIL/uL (ref 3.87–5.11)
RDW: 13.6 % (ref 11.5–15.5)
WBC: 3.6 10*3/uL — ABNORMAL LOW (ref 4.0–10.5)
nRBC: 0 % (ref 0.0–0.2)

## 2019-02-15 LAB — HCG, QUANTITATIVE, PREGNANCY: hCG, Beta Chain, Quant, S: 4651 m[IU]/mL — ABNORMAL HIGH (ref <5)

## 2019-02-15 LAB — POCT PREGNANCY, URINE: PREG TEST UR: POSITIVE — AB

## 2019-02-15 NOTE — Discharge Instructions (Signed)
° °Vaginal Bleeding During Pregnancy, First Trimester ° °A small amount of bleeding from the vagina (spotting) is relatively common during early pregnancy. It usually stops on its own. Various things may cause bleeding or spotting during early pregnancy. Some bleeding may be related to the pregnancy, and some may not. In many cases, the bleeding is normal and is not a problem. However, bleeding can also be a sign of something serious. Be sure to tell your health care provider about any vaginal bleeding right away. °Some possible causes of vaginal bleeding during the first trimester include: °· Infection or inflammation of the cervix. °· Growths (polyps) on the cervix. °· Miscarriage or threatened miscarriage. °· Pregnancy tissue developing outside of the uterus (ectopic pregnancy). °· A mass of tissue developing in the uterus due to an egg being fertilized incorrectly (molar pregnancy). °Follow these instructions at home: °Activity °· Follow instructions from your health care provider about limiting your activity. Ask what activities are safe for you. °· If needed, make plans for someone to help with your regular activities. °· Do not have sex or orgasms until your health care provider says that this is safe. °General instructions °· Take over-the-counter and prescription medicines only as told by your health care provider. °· Pay attention to any changes in your symptoms. °· Do not use tampons or douche. °· Write down how many pads you use each day, how often you change pads, and how soaked (saturated) they are. °· If you pass any tissue from your vagina, save the tissue so you can show it to your health care provider. °· Keep all follow-up visits as told by your health care provider. This is important. °Contact a health care provider if: °· You have vaginal bleeding during any part of your pregnancy. °· You have cramps or labor pains. °· You have a fever. °Get help right away if: °· You have severe cramps in your  back or abdomen. °· You pass large clots or a large amount of tissue from your vagina. °· Your bleeding increases. °· You feel light-headed or weak, or you faint. °· You have chills. °· You are leaking fluid or have a gush of fluid from your vagina. °Summary °· A small amount of bleeding (spotting) from the vagina is relatively common during early pregnancy. °· Various things may cause bleeding or spotting in early pregnancy. °· Be sure to tell your health care provider about any vaginal bleeding right away. °This information is not intended to replace advice given to you by your health care provider. Make sure you discuss any questions you have with your health care provider. °Document Released: 09/01/2005 Document Revised: 02/24/2017 Document Reviewed: 02/24/2017 °Elsevier Interactive Patient Education © 2019 [employer] °[facility]  ° ° ° °[facility]     Phone: [phone] ° °[facility]  Phone: [phone] ° °[facility]  Phone: [phone] ° °[facility]                           Phone: [phone] ° °[facility]          Phone: [phone] ° °[facility]    Phone: [phone]  ° °[facility]    Phone: [phone] ° °[facility]    Phone: [phone] ° °[facility]      Phone: [phone] ° °[facility]    Phone: [phone] ° °[facility]  Phone: [phone] ° °[facility]               Phone: [phone] ° °[facility]   Phone: [phone] ° °[facility]    Phone: [phone] ° ° ° ° ° ° ° ° °  ° ° ° °  ° ° °

## 2019-02-15 NOTE — MAU Note (Signed)
Pt c/o spotting yesterday none today. C/o back pain and cramping today.

## 2019-02-15 NOTE — MAU Provider Note (Signed)
History     CSN: [id number]  Arrival date and time: 02/15/19 0124   First Provider Initiated Contact with Patient 02/15/19 1105      Chief Complaint  Patient presents with  . Back Pain   [name] is a 23 y.o. G1P0000 at [redacted]w[redacted]d by known LMP who presents to MAU for vaginal bleeding which began at this morning, just after midnight. Pt states she saw three spots of blood on the toilet tissue when wiping after urination early this morning. Denies any bleeding since that time. Pt is also reporting a vaginal odor, but denies abnormal discharge, itching. Pt denies urinary sx of dysuria, urgency. Pt denies N/V, diarrhea, constipation.  Lightheaded/dizzy? no Significant pelvic pain or cramping? no - pt reports mild cramping and back pain Current pregnancy problems? Hx of patent PDA Blood Type? unknown Allergies? PCN/Flagyl - hives Current medications? PNVs only Current PNC & next appt? None - pt states she cannot find an OB provider that is currently accepting new patients  Mother present for entire visit.    OB History    Gravida  1   Para  0   Term  0   Preterm  0   AB  0   Living  0     SAB  0   TAB  0   Ectopic  0   Multiple  0   Live Births              Past Medical History:  Diagnosis Date  . Acid reflux   . H/O repair of patent ductus arteriosus   . PID (acute pelvic inflammatory disease) 05/2017    Past Surgical History:  Procedure Laterality Date  . PATENT DUCTUS ARTERIOUS REPAIR     as an infant    Family History  Problem Relation Age of Onset  . Arthritis Mother   . Arthritis Maternal Grandmother   . Breast cancer Paternal Grandmother   . Down syndrome Sister   . Diabetes Maternal Uncle     Social History   Tobacco Use  . Smoking status: Never Smoker  . Smokeless tobacco: Never Used  Substance Use Topics  . Alcohol use: No  . Drug use: No    Allergies:  Allergies  Allergen Reactions  . Flagyl [Metronidazole]  Hives  . Other Hives    potatoes  . Penicillins Hives    Has patient had a PCN reaction causing immediate rash, facial/tongue/throat swelling, SOB or lightheadedness with hypotension: Yes Has patient had a PCN reaction causing severe rash involving mucus membranes or skin necrosis: No Has patient had a PCN reaction that required hospitalization No Has patient had a PCN reaction occurring within the last 10 years: No If all of the above answers are "NO", then may proceed with Cephalosporin use.     Medications Prior to Admission  Medication Sig Dispense Refill Last Dose  . Prenatal Vit-Fe Fumarate-FA (PRENATAL COMPLETE) 14-0.4 MG TABS Take 1 tablet by mouth daily. 60 each 3     Review of Systems  Constitutional: Negative for chills, diaphoresis, fatigue and fever.  Gastrointestinal: Negative for abdominal distention, abdominal pain, constipation, diarrhea, nausea and vomiting.  Genitourinary: Positive for pelvic pain (cramping) and vaginal bleeding. Negative for difficulty urinating, dysuria, flank pain, urgency and vaginal discharge.       Vaginal odor  Neurological: Negative for dizziness, light-headedness and headaches.   Physical Exam   Blood pressure 116/71, pulse 88, temperature 98.3 F (36.8 C), resp.  rate 18, height  (1.702 m), weight 59.4 kg, last menstrual period 01/03/2019.  Physical Exam  Constitutional: She is oriented to person, place, and time. She appears well-developed and well-nourished. No distress.  Respiratory: Effort normal.  GI: Soft. She exhibits no distension and no mass. There is abdominal tenderness (RUQ). There is no rebound, no guarding and no CVA tenderness.  Genitourinary:    Vagina and uterus normal.  Uterus is not tender. Cervix exhibits no motion tenderness, no discharge and no friability. Right adnexum displays no mass, no tenderness and no fullness. Left adnexum displays no mass, no tenderness and no fullness.    No vaginal discharge or  bleeding.  No bleeding in the vagina.    Genitourinary Comments: Strawberry cervix   Neurological: She is alert and oriented to person, place, and time.  Skin: Skin is warm and dry. She is not diaphoretic.  Psychiatric: She has a normal mood and affect. Her behavior is normal.   Results for orders placed or performed during the hospital encounter of 02/15/19 (from the past 24 hour(s))  Pregnancy, urine POC     Status: Abnormal   Collection Time: 02/15/19  9:35 AM  Result Value Ref Range   Preg Test, Ur POSITIVE (A) NEGATIVE  Urinalysis, Routine w reflex microscopic     Status: Abnormal   Collection Time: 02/15/19 10:35 AM  Result Value Ref Range   Color, Urine YELLOW YELLOW   APPearance HAZY (A) CLEAR   Specific Gravity, Urine 1.017 1.005 - 1.030   pH 7.0 5.0 - 8.0   Glucose, UA NEGATIVE NEGATIVE mg/dL   Hgb urine dipstick NEGATIVE NEGATIVE   Bilirubin Urine NEGATIVE NEGATIVE   Ketones, ur 5 (A) NEGATIVE mg/dL   Protein, ur NEGATIVE NEGATIVE mg/dL   Nitrite NEGATIVE NEGATIVE   Leukocytes,Ua LARGE (A) NEGATIVE   RBC / HPF 0-5 0 - 5 RBC/hpf   WBC, UA >50 (H) 0 - 5 WBC/hpf   Bacteria, UA RARE (A) NONE SEEN   Squamous Epithelial / LPF 11-20 0 - 5  Wet prep, genital     Status: Abnormal   Collection Time: 02/15/19 11:17 AM  Result Value Ref Range   Yeast Wet Prep HPF POC NONE SEEN NONE SEEN   Trich, Wet Prep NONE SEEN NONE SEEN   Clue Cells Wet Prep HPF POC NONE SEEN NONE SEEN   WBC, Wet Prep HPF POC MANY (A) NONE SEEN   Sperm PRESENT   CBC     Status: Abnormal   Collection Time: 02/15/19 12:13 PM  Result Value Ref Range   WBC 3.6 (L) 4.0 - 10.5 K/uL   RBC 4.49 3.87 - 5.11 MIL/uL   Hemoglobin 12.0 12.0 - 15.0 g/dL   HCT 16.1 09.6 - 04.5 %   MCV 83.1 80.0 - 100.0 fL   MCH 26.7 26.0 - 34.0 pg   MCHC 32.2 30.0 - 36.0 g/dL   RDW 40.9 81.1 - 91.4 %   Platelets 160 150 - 400 K/uL   nRBC 0.0 0.0 - 0.2 %  ABO/Rh     Status: None   Collection Time: 02/15/19 12:13 PM  Result  Value Ref Range   ABO/RH(D)      O POS Performed at [facility], [address], [place], [place] [address]   hCG, quantitative, pregnancy     Status: Abnormal   Collection Time: 02/15/19 12:13 PM  Result Value Ref Range   hCG, Beta Chain, Quant, S 4,651 (H) <5  mIU/mL  Comprehensive metabolic panel     Status: None   Collection Time: 02/15/19 12:13 PM  Result Value Ref Range   Sodium 136 135 - 145 mmol/L   Potassium 3.9 3.5 - 5.1 mmol/L   Chloride 106 98 - 111 mmol/L   CO2 25 22 - 32 mmol/L   Glucose, Bld 79 70 - 99 mg/dL   BUN 7 6 - 20 mg/dL   Creatinine, Ser 0.45 0.44 - 1.00 mg/dL   Calcium 9.3 8.9 - 99.7 mg/dL   Total Protein 7.2 6.5 - 8.1 g/dL   Albumin 4.1 3.5 - 5.0 g/dL   AST 17 15 - 41 U/L   ALT 13 0 - 44 U/L   Alkaline Phosphatase 48 38 - 126 U/L   Total Bilirubin 1.0 0.3 - 1.2 mg/dL   GFR calc non Af Amer >60 >60 mL/min   GFR calc Af Amer >60 >60 mL/min   Anion gap 5 5 - 15   US Ob Less Than 14 Weeks With Ob Transvaginal  Result Date: 02/15/2019 CLINICAL DATA:  Vaginal bleeding in early pregnancy. EXAM: OBSTETRIC <14 WK [place] AND TRANSVAGINAL OB US TECHNIQUE: Both transabdominal and transvaginal ultrasound examinations were performed for complete evaluation of the gestation as well as the maternal uterus, adnexal regions, and pelvic cul-de-sac. Transvaginal technique was performed to assess early pregnancy. COMPARISON:  None. FINDINGS: Intrauterine gestational sac: Single Yolk sac:  Visualized. Embryo:  Visualized. Cardiac Activity: Not Visualized. CRL:  2 mm; too small for dating; approximately 5 weeks Subchorionic hemorrhage:  Moderate subchorionic hemorrhage noted. Maternal uterus/adnexae: No fibroids identified. Normal appearance of left ovary. A complex cystic and solid lesion is seen in the right ovary which measures 3.6 cm. No internal blood flow is seen within this lesion, likely representing a hemorrhagic corpus luteum. No abnormal free-fluid. IMPRESSION: Single  IUP at approximately [redacted] weeks gestational age. Suggest correlation with serial b-hCG levels, and consider followup ultrasound to assess viability in 7-10 days. Moderate subchorionic hemorrhage. Probable hemorrhagic right ovarian corpus luteum. Recommend continued attention on follow-up ultrasound. Electronically Signed   By: Myles Rosenthal M.D.   On: 02/15/2019 13:35     MAU Course  Procedures  MDM -pt presented with minimal vaginal bleeding plus cramping/back pain - ectopic work-up performed -UA: suspicious for UTI but negative nitrites, pt continues to deny urinary sx, sending for culture -CBC, CMP WNL -ABO: O positive, RhoGAM not indicated -hCG: 4,651 -[place]: approx 5wk IUP, no cardiac activity; moderate subchorionic hemorrhage, hemorrhagic right corpus luteal cyst -WetPrep: negative for yeast, trich, clue cells, +WBCs, few bacteria, +sperm  Orders Placed This Encounter  Procedures  . Wet prep, genital    Standing Status:   Standing    Number of Occurrences:   1  . Culture, OB Urine    Standing Status:   Standing    Number of Occurrences:   1  . US OB LESS THAN 14 WEEKS WITH OB TRANSVAGINAL    Standing Status:   Standing    Number of Occurrences:   1    Order Specific Question:   Symptom/Reason for Exam    Answer:   Bleeding in early pregnancy [741423]  . Urinalysis, Routine w reflex microscopic    Standing Status:   Standing    Number of Occurrences:   1  . CBC    Standing Status:   Standing    Number of Occurrences:   1  . hCG, quantitative, pregnancy    Standing Status:  Standing    Number of Occurrences:   1  . Comprehensive metabolic panel    Standing Status:   Standing    Number of Occurrences:   1  . Pregnancy, urine POC    Standing Status:   Standing    Number of Occurrences:   1  . ABO/Rh    Standing Status:   Standing    Number of Occurrences:   1  . Discharge patient    Order Specific Question:   Discharge disposition    Answer:   01-Home or Self Care [1]     Order Specific Question:   Discharge patient date    Answer:   02/15/2019   No orders of the defined types were placed in this encounter.    Assessment and Plan   1. Bleeding in early pregnancy   2. [redacted] weeks gestation of pregnancy   3. Intrauterine pregnancy   4. Blood type, Rh positive    Allergies as of 02/15/2019      Reactions   Flagyl [metronidazole] Hives   Other Hives   potatoes   Penicillins Hives   Has patient had a PCN reaction causing immediate rash, facial/tongue/throat swelling, SOB or lightheadedness with hypotension: Yes Has patient had a PCN reaction causing severe rash involving mucus membranes or skin necrosis: No Has patient had a PCN reaction that required hospitalization No Has patient had a PCN reaction occurring within the last 10 years: No If all of the above answers are "NO", then may proceed with Cephalosporin use.      Medication List    TAKE these medications   Prenatal Complete 14-0.4 MG Tabs Take 1 tablet by mouth daily.       -will call with culture results, if positive -pt discharged to home in stable condition with mother -bleeding/pain/return MAU precautions given -list of [facility] area OB providers given  Odie Sera Nugent 02/15/2019, 1:59 PM

## 2019-02-16 LAB — ABO/RH: ABO/RH(D): O POS

## 2019-02-16 LAB — GC/CHLAMYDIA PROBE AMP (~~LOC~~) NOT AT ARMC
Chlamydia: NEGATIVE
Neisseria Gonorrhea: NEGATIVE

## 2019-02-19 LAB — CULTURE, OB URINE

## 2019-02-20 ENCOUNTER — Other Ambulatory Visit: Payer: Self-pay | Admitting: Advanced Practice Midwife

## 2019-02-20 MED ORDER — CEPHALEXIN 500 MG PO CAPS: 500.0000 mg | ORAL_CAPSULE | Freq: Three times a day (TID) | ORAL | 0 refills | Status: DC

## 2019-02-20 NOTE — Progress Notes (Signed)
Patient with +GBS in urine. RX for Keflex 500mg  TID x 7 days sent to pharmacy. Patient has had cephalosporin in the past without problem.   Thressa Sheller DNP, CNM  02/20/19  8:42 AM

## 2019-02-22 ENCOUNTER — Other Ambulatory Visit: Payer: Self-pay | Admitting: Obstetrics and Gynecology

## 2019-02-22 ENCOUNTER — Telehealth: Payer: Self-pay | Admitting: *Deleted

## 2019-02-22 NOTE — Telephone Encounter (Signed)
Patient called stating she was in contact with someone that was in contact with COVID-19.  Patient denied having any symptoms of COVID-19.  Advised patient that she does not need any testing at this time. If she start to develop any symptoms to call her PCP, go to urgent care or ED for further evaluation. If she prefer she can self quarantine for 2 weeks. Clovis Pu, RN

## 2019-02-28 ENCOUNTER — Telehealth: Payer: Self-pay | Admitting: General Practice

## 2019-02-28 NOTE — Telephone Encounter (Signed)
Pt called and stated she was High Risk d/t heart condition, therefore her pregnancy is considered HR.  Attempted to get pt scheduled at [facility] for prenatal care.  Pt stated that she wanted to go to an office with one doctor.  Explained to Pt that our providers alternate to our sister offices and that we try our best to meet our pts requests, however their is no guarantee that she will see the same provider with each visit.  Pt verbalized understanding. Offered to schedule at [facility] or [facility].  Patient asked if she can have those numbers to call herself.

## 2019-03-08 DIAGNOSIS — Z3A08 8 weeks gestation of pregnancy: Secondary | ICD-10-CM | POA: Diagnosis not present

## 2019-03-08 DIAGNOSIS — O3680X9 Pregnancy with inconclusive fetal viability, other fetus: Secondary | ICD-10-CM | POA: Diagnosis not present

## 2019-03-08 DIAGNOSIS — N925 Other specified irregular menstruation: Secondary | ICD-10-CM | POA: Diagnosis not present

## 2019-03-08 LAB — OB RESULTS CONSOLE RUBELLA ANTIBODY, IGM: Rubella: IMMUNE

## 2019-03-08 LAB — OB RESULTS CONSOLE ANTIBODY SCREEN: Antibody Screen: NEGATIVE

## 2019-03-08 LAB — OB RESULTS CONSOLE GC/CHLAMYDIA
Chlamydia: NEGATIVE
Gonorrhea: NEGATIVE

## 2019-03-08 LAB — OB RESULTS CONSOLE HIV ANTIBODY (ROUTINE TESTING): HIV: NONREACTIVE

## 2019-03-08 LAB — OB RESULTS CONSOLE ABO/RH: RH Type: POSITIVE

## 2019-03-08 LAB — OB RESULTS CONSOLE HEPATITIS B SURFACE ANTIGEN: Hepatitis B Surface Ag: NEGATIVE

## 2019-03-08 LAB — OB RESULTS CONSOLE RPR: RPR: NONREACTIVE

## 2019-03-27 DIAGNOSIS — Z3481 Encounter for supervision of other normal pregnancy, first trimester: Secondary | ICD-10-CM | POA: Diagnosis not present

## 2019-03-27 DIAGNOSIS — Z315 Encounter for genetic counseling: Secondary | ICD-10-CM | POA: Diagnosis not present

## 2019-05-15 ENCOUNTER — Inpatient Hospital Stay (HOSPITAL_COMMUNITY)
Admission: AD | Admit: 2019-05-15 | Discharge: 2019-05-15 | Disposition: A | Discharge status: Home / Self Care | Payer: Medicaid Other | Attending: Obstetrics and Gynecology | Admitting: Obstetrics and Gynecology

## 2019-05-15 ENCOUNTER — Other Ambulatory Visit: Payer: Self-pay

## 2019-05-15 ENCOUNTER — Encounter (HOSPITAL_COMMUNITY): Payer: Self-pay | Admitting: *Deleted

## 2019-05-15 DIAGNOSIS — O209 Hemorrhage in early pregnancy, unspecified: Secondary | ICD-10-CM | POA: Diagnosis present

## 2019-05-15 DIAGNOSIS — O99612 Diseases of the digestive system complicating pregnancy, second trimester: Secondary | ICD-10-CM | POA: Diagnosis not present

## 2019-05-15 DIAGNOSIS — Z880 Allergy status to penicillin: Secondary | ICD-10-CM | POA: Diagnosis not present

## 2019-05-15 DIAGNOSIS — K5900 Constipation, unspecified: Secondary | ICD-10-CM | POA: Insufficient documentation

## 2019-05-15 DIAGNOSIS — O26892 Other specified pregnancy related conditions, second trimester: Secondary | ICD-10-CM | POA: Diagnosis not present

## 2019-05-15 DIAGNOSIS — O26852 Spotting complicating pregnancy, second trimester: Secondary | ICD-10-CM | POA: Insufficient documentation

## 2019-05-15 DIAGNOSIS — Z3A18 18 weeks gestation of pregnancy: Secondary | ICD-10-CM | POA: Diagnosis not present

## 2019-05-15 DIAGNOSIS — N939 Abnormal uterine and vaginal bleeding, unspecified: Secondary | ICD-10-CM

## 2019-05-15 LAB — URINALYSIS, ROUTINE W REFLEX MICROSCOPIC
Bacteria, UA: NONE SEEN
Bilirubin Urine: NEGATIVE
Glucose, UA: NEGATIVE mg/dL
Hgb urine dipstick: NEGATIVE
Ketones, ur: NEGATIVE mg/dL
Nitrite: NEGATIVE
Protein, ur: NEGATIVE mg/dL
Specific Gravity, Urine: 1.025 (ref 1.005–1.030)
pH: 6 (ref 5.0–8.0)

## 2019-05-15 MED ORDER — POLYETHYLENE GLYCOL 3350 17 G PO PACK: 17.0000 g | PACK | Freq: Every day | ORAL | 0 refills | Status: DC

## 2019-05-15 NOTE — Discharge Instructions (Signed)

## 2019-05-15 NOTE — MAU Note (Signed)
Was having back pain and abd pain, realized she needed to use the restroom, had been constipated, thinks she had a bm on the weekend.  Was straining to have BM and had some spotting.  No longer having abd or back pain.

## 2019-05-15 NOTE — MAU Provider Note (Signed)
History     CSN: [id number]  Arrival date and time: 05/15/19 1403   First Provider Initiated Contact with Patient 05/15/19 1615      Chief Complaint  Patient presents with  . Vaginal Bleeding   Lori Moses is a 23 y.o. G1P0000 at 1974w6d who presents today with vaginal bleeding. She states that she attempted to have a BM today and she was unable. It has been about 4 days since she has had a BM. After straining she had about 2-3 drops of blood. She called her OB office and was advised to come here for evaluation.   Vaginal Bleeding  The patient's primary symptoms include vaginal bleeding. This is a new problem. The current episode started today. The problem occurs intermittently. The problem has been resolved. The patient is experiencing no pain. She is pregnant. Associated symptoms include constipation (Last BM was 4 days ago. Attempted a BM today, but did not produce one. ). Pertinent negatives include no chills or fever. Vaginal bleeding amount: 2 spots of blood after a bowel movement.  The symptoms are aggravated by bowel movements. She has tried nothing for the symptoms. Sexual activity: denies intercourse in the last 24 hours.     OB History    Gravida  1   Para  0   Term  0   Preterm  0   AB  0   Living  0     SAB  0   TAB  0   Ectopic  0   Multiple  0   Live Births              Past Medical History:  Diagnosis Date  . Acid reflux   . H/O repair of patent ductus arteriosus   . PID (acute pelvic inflammatory disease) 05/2017    Past Surgical History:  Procedure Laterality Date  . PATENT DUCTUS ARTERIOUS REPAIR     as an infant    Family History  Problem Relation Age of Onset  . Arthritis Mother   . Arthritis Maternal Grandmother   . Breast cancer Paternal Grandmother   . Down syndrome Sister   . Diabetes Maternal Uncle     Social History   Tobacco Use  . Smoking status: Never Smoker  . Smokeless tobacco: Never Used  Substance Use  Topics  . Alcohol use: No  . Drug use: No    Allergies:  Allergies  Allergen Reactions  . Flagyl [Metronidazole] Hives  . Other Hives    potatoes  . Penicillins Hives    Has patient had a PCN reaction causing immediate rash, facial/tongue/throat swelling, SOB or lightheadedness with hypotension: Yes Has patient had a PCN reaction causing severe rash involving mucus membranes or skin necrosis: No Has patient had a PCN reaction that required hospitalization No Has patient had a PCN reaction occurring within the last 10 years: No If all of the above answers are "NO", then may proceed with Cephalosporin use.     Medications Prior to Admission  Medication Sig Dispense Refill Last Dose  . Prenatal Vit-Fe Fumarate-FA (PRENATAL COMPLETE) 14-0.4 MG TABS Take 1 tablet by mouth daily. 60 each 3 05/15/2019 at Unknown time  . cephALEXin (KEFLEX) 500 MG capsule Take 1 capsule (500 mg total) by mouth 3 (three) times daily. 21 capsule 0     Review of Systems  Constitutional: Negative for chills and fever.  Gastrointestinal: Positive for constipation (Last BM was 4 days ago. Attempted a BM today, but  did not produce one. ).  Genitourinary: Positive for vaginal bleeding.   Physical Exam   Blood pressure (!) 102/57, pulse 86, temperature 98.7 F (37.1 C), temperature source Oral, resp. rate 16, weight 68.7 kg, last menstrual period 01/03/2019, SpO2 100 %.  Physical Exam  Nursing note and vitals reviewed. Constitutional: She is oriented to person, place, and time. She appears well-developed and well-nourished. No distress.  HENT:  Head: Normocephalic.  Cardiovascular: Normal rate.  Respiratory: Effort normal.  GI: Soft. There is no abdominal tenderness. There is no rebound.  Genitourinary:    Genitourinary Comments:  External: no lesion Vagina: small amount of white discharge. No blood seen  Cervix: pink, smooth, closed/thick.  Uterus: AGA    Neurological: She is alert and oriented to  person, place, and time.  Skin: Skin is warm and dry.  Psychiatric: She has a normal mood and affect.  + FHT 161 with doppler   Results for orders placed or performed during the hospital encounter of 05/15/19 (from the past 24 hour(s))  Urinalysis, Routine w reflex microscopic     Status: Abnormal   Collection Time: 05/15/19  2:34 PM  Result Value Ref Range   Color, Urine YELLOW YELLOW   APPearance CLEAR CLEAR   Specific Gravity, Urine 1.025 1.005 - 1.030   pH 6.0 5.0 - 8.0   Glucose, UA NEGATIVE NEGATIVE mg/dL   Hgb urine dipstick NEGATIVE NEGATIVE   Bilirubin Urine NEGATIVE NEGATIVE   Ketones, ur NEGATIVE NEGATIVE mg/dL   Protein, ur NEGATIVE NEGATIVE mg/dL   Nitrite NEGATIVE NEGATIVE   Leukocytes,Ua TRACE (A) NEGATIVE   RBC / HPF 0-5 0 - 5 RBC/hpf   WBC, UA 0-5 0 - 5 WBC/hpf   Bacteria, UA NONE SEEN NONE SEEN   Squamous Epithelial / LPF 0-5 0 - 5   Mucus PRESENT      MAU Course  Procedures  MDM Patient offered enema here. She declines. She would prefer to treat this at home.   Assessment and Plan   1. Constipation, unspecified constipation type   2. [redacted] weeks gestation of pregnancy   3. Vaginal spotting    DC home Comfort measures reviewed  2nd Trimester precautions  Bleeding precautions RX: Miralax as directed  Return to MAU as needed FU with OB as planned  [facility] Follow up.   Specialty:  Obstetrics and Gynecology Contact information: [address] [address] [phone]         Heather Hogan DNP, CNM  05/15/19  4:30 PM

## 2019-05-23 DIAGNOSIS — Z3492 Encounter for supervision of normal pregnancy, unspecified, second trimester: Secondary | ICD-10-CM | POA: Diagnosis not present

## 2019-05-23 DIAGNOSIS — Z3A19 19 weeks gestation of pregnancy: Secondary | ICD-10-CM | POA: Diagnosis not present

## 2019-05-23 DIAGNOSIS — Z363 Encounter for antenatal screening for malformations: Secondary | ICD-10-CM | POA: Diagnosis not present

## 2019-05-24 IMAGING — US US PELVIS COMPLETE
1 series · 15 of 25 positions shown · non-contrast
Comparison: None

CLINICAL DATA: None lower quadrant and back pain with cramping x2
weeks.

EXAM:
TRANSABDOMINAL AND TRANSVAGINAL ULTRASOUND OF PELVIS
TECHNIQUE: Both transabdominal and transvaginal ultrasound examinations of the
pelvis were performed. Transabdominal technique was performed for
global imaging of the pelvis including uterus, ovaries, adnexal
regions, and pelvic cul-de-sac. It was necessary to proceed with
endovaginal exam following the transabdominal exam to visualize the
uterus, endometrium and both ovaries.

[Series 1: us pelvis complete · 15 of 57 slices shown]
[im 1/57]
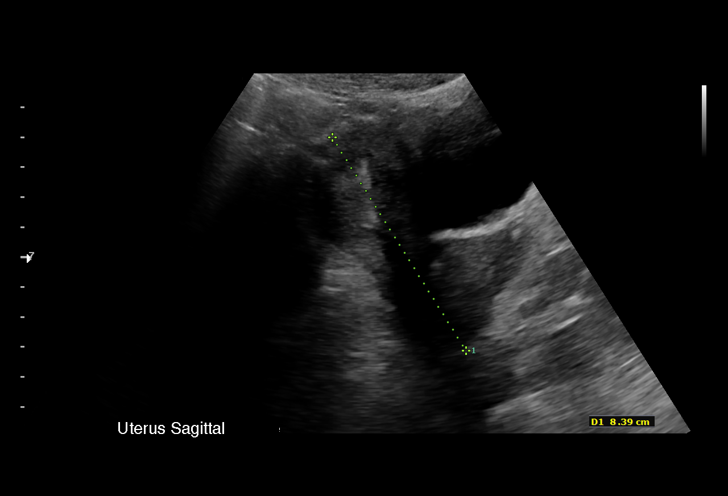
[im 5/57]
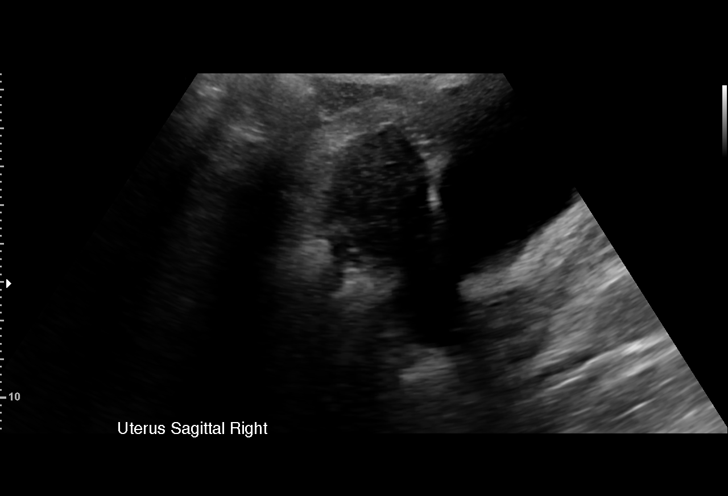
[im 10/57]
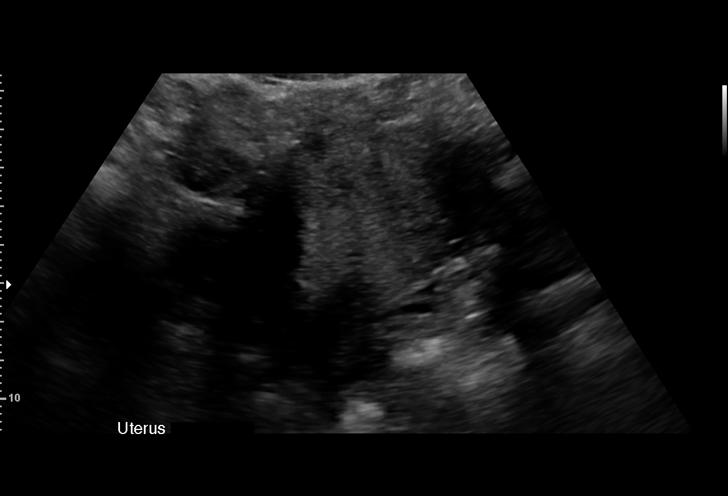
[im 12/57]
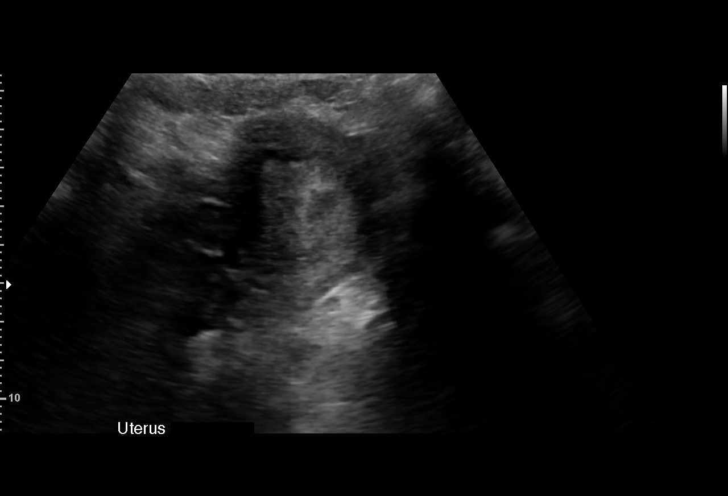
[im 17/57]
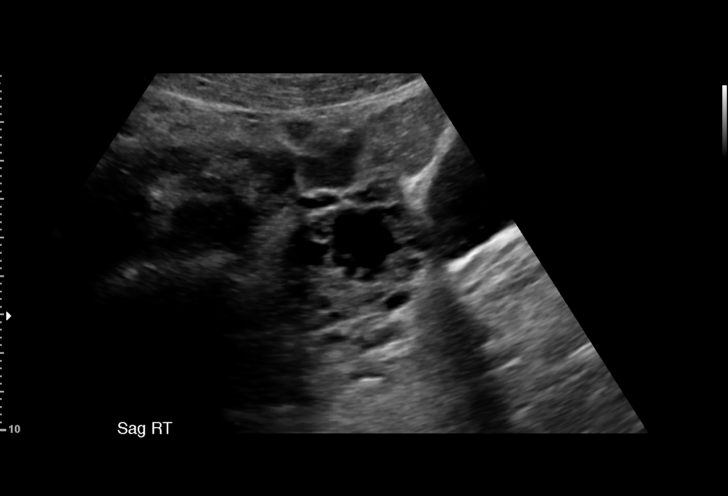
[im 22/57]
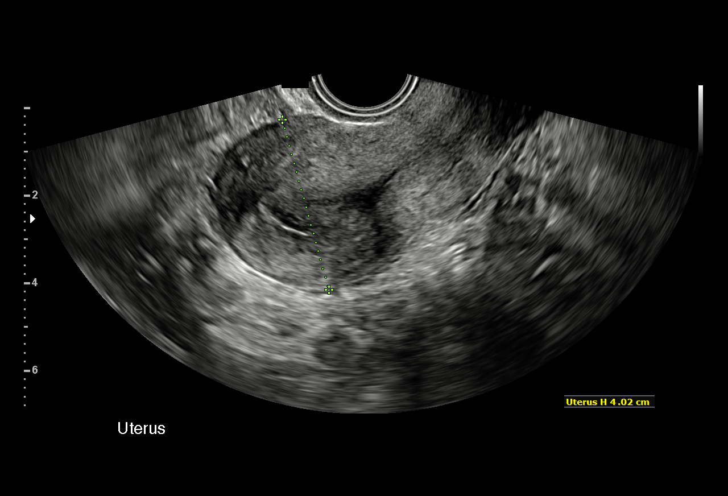
[im 24/57]
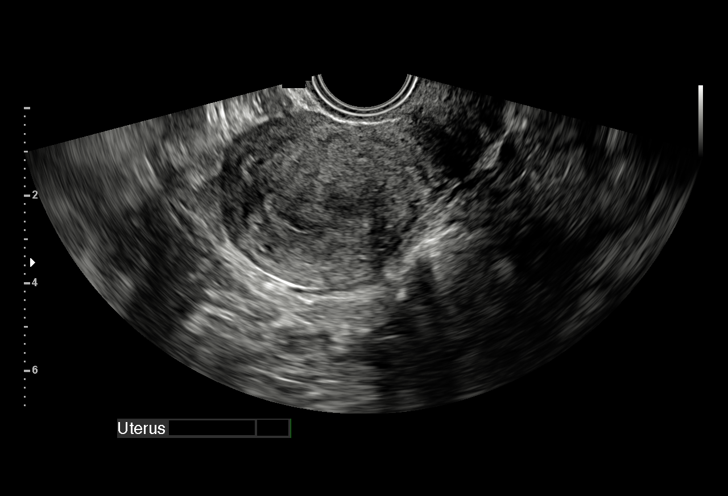
[im 29/57]
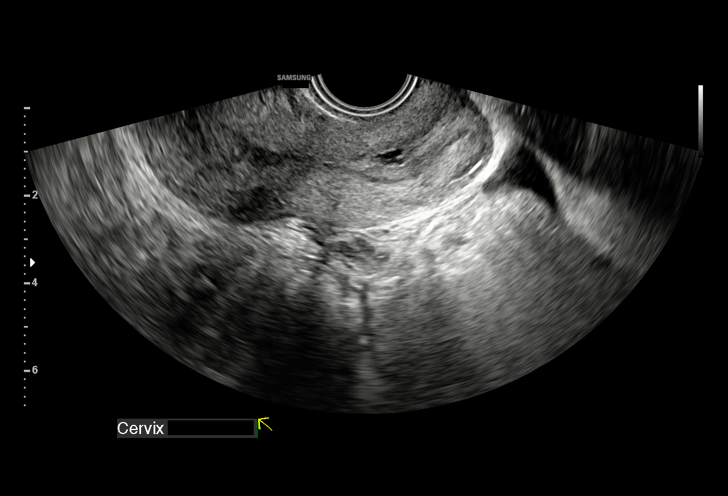
[im 33/57]
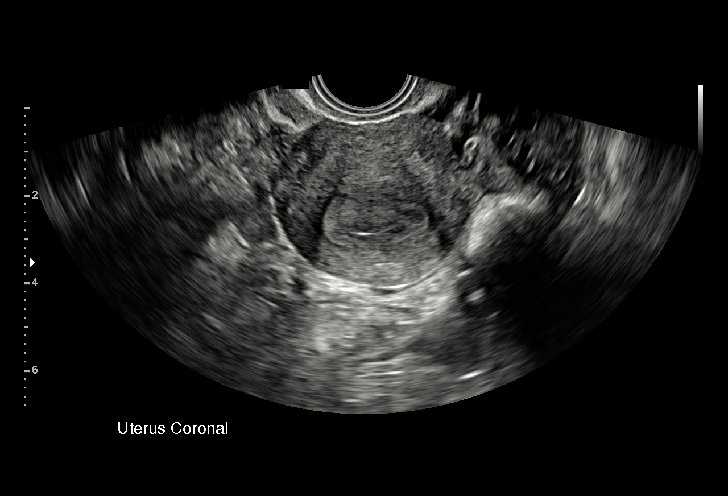
[im 36/57]
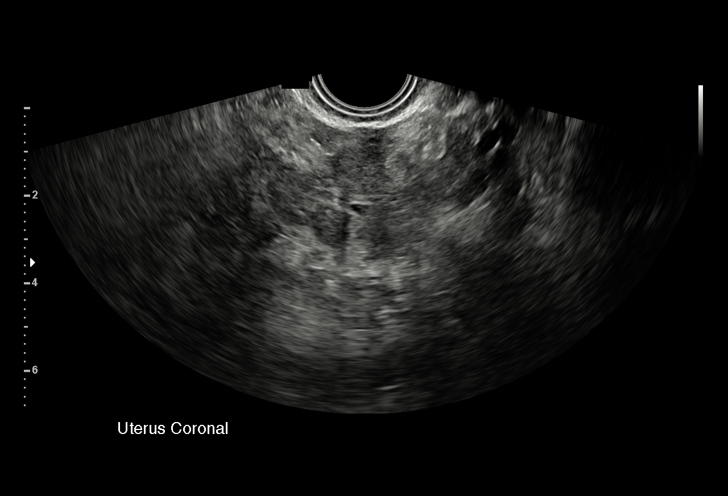
[im 40/57]
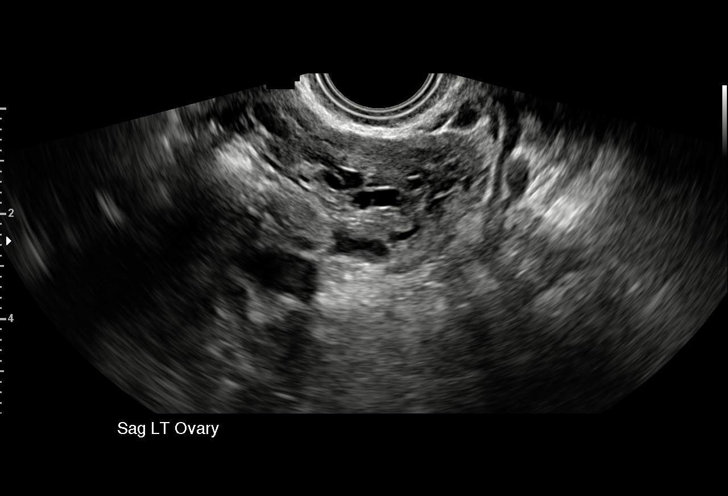
[im 45/57]
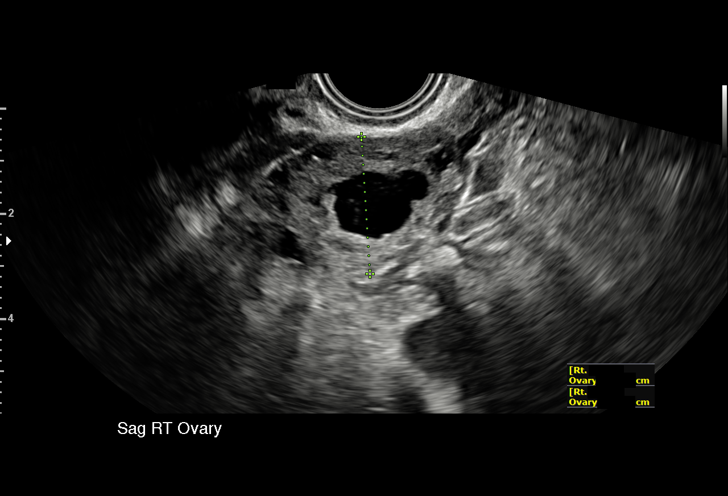
[im 47/57]
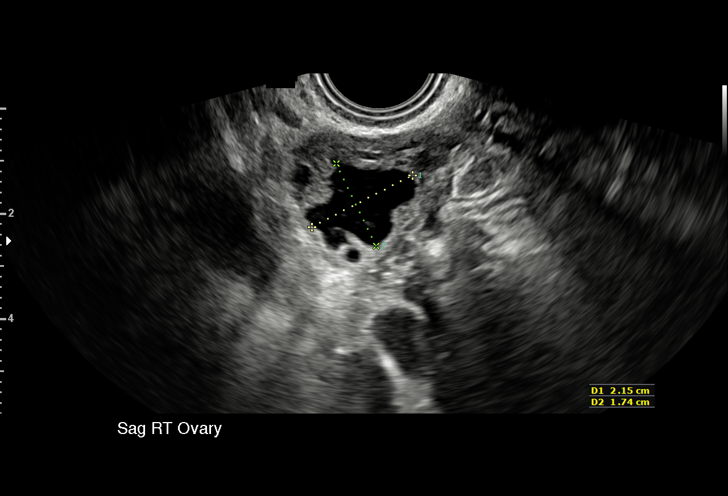
[im 52/57]
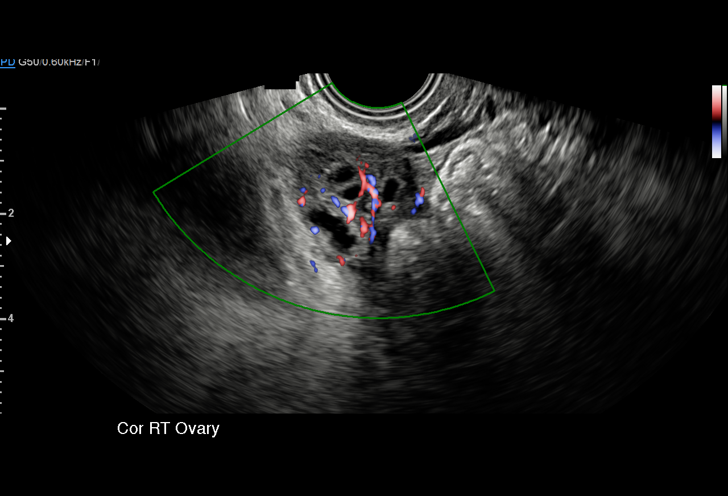
[im 57/57]
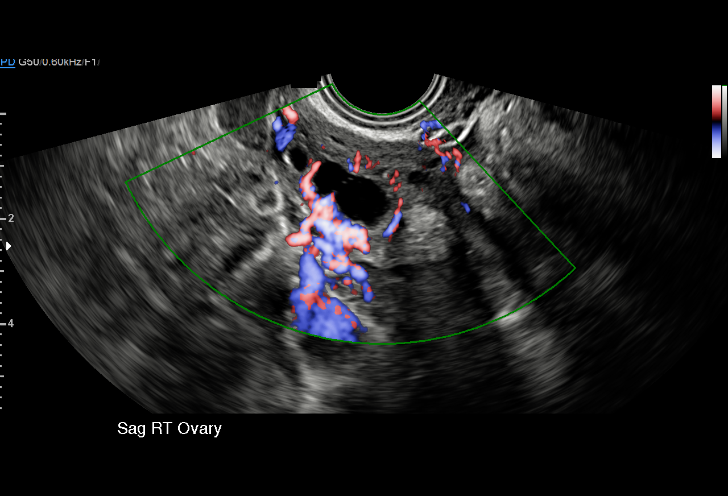

[15 of 25 positions shown; findings below may reference images not displayed]

FINDINGS: Uterus

Measurements: 8.3 x 4 x 4.6 cm. No fibroids or other mass
visualized.

Endometrium

Thickness: 8.8 mm.  No focal abnormality visualized.

Right ovary

Measurements: 3.7 x 2.6 x 3.8 cm. There appears to be an involuting
cyst or follicle associated with the right ovary measuring 2.2 x
x 1.8 cm with internal debris. No mural nodularity or
calcifications.

Left ovary

Measurements: 3 x 2.2 x 1.8 cm. Normal appearance/no adnexal mass.

Other findings

No abnormal free fluid.
IMPRESSION: Involuting follicle or cyst in the right ovary measuring 2.2 x 1.7 x
1.8 cm with slightly crenulated margins and internal debris noted.
Otherwise negative exam.

## 2019-07-03 DIAGNOSIS — Z136 Encounter for screening for cardiovascular disorders: Secondary | ICD-10-CM | POA: Diagnosis not present

## 2019-08-03 ENCOUNTER — Encounter (HOSPITAL_COMMUNITY): Payer: Self-pay | Admitting: *Deleted

## 2019-08-03 ENCOUNTER — Inpatient Hospital Stay (HOSPITAL_COMMUNITY)
Admission: AD | Admit: 2019-08-03 | Discharge: 2019-08-03 | Disposition: A | Discharge status: Home / Self Care | Payer: Medicaid Other | Attending: Obstetrics and Gynecology | Admitting: Obstetrics and Gynecology

## 2019-08-03 ENCOUNTER — Other Ambulatory Visit: Payer: Self-pay

## 2019-08-03 DIAGNOSIS — Z880 Allergy status to penicillin: Secondary | ICD-10-CM | POA: Diagnosis not present

## 2019-08-03 DIAGNOSIS — Z3A30 30 weeks gestation of pregnancy: Secondary | ICD-10-CM | POA: Insufficient documentation

## 2019-08-03 DIAGNOSIS — O4693 Antepartum hemorrhage, unspecified, third trimester: Secondary | ICD-10-CM | POA: Diagnosis not present

## 2019-08-03 DIAGNOSIS — Z3689 Encounter for other specified antenatal screening: Secondary | ICD-10-CM

## 2019-08-03 HISTORY — DX: Anemia, unspecified: D64.9

## 2019-08-03 LAB — URINALYSIS, ROUTINE W REFLEX MICROSCOPIC
Bilirubin Urine: NEGATIVE
Glucose, UA: NEGATIVE mg/dL
Hgb urine dipstick: NEGATIVE
Ketones, ur: NEGATIVE mg/dL
Nitrite: NEGATIVE
Protein, ur: NEGATIVE mg/dL
Specific Gravity, Urine: 1.026 (ref 1.005–1.030)
pH: 6 (ref 5.0–8.0)

## 2019-08-03 LAB — WET PREP, GENITAL
Clue Cells Wet Prep HPF POC: NONE SEEN
Sperm: NONE SEEN
Trich, Wet Prep: NONE SEEN
Yeast Wet Prep HPF POC: NONE SEEN

## 2019-08-03 NOTE — MAU Provider Note (Signed)
History     CSN: [id number]  Arrival date and time: 08/03/19 0175   First Provider Initiated Contact with Patient 08/03/19 2018      Chief Complaint  Patient presents with  . Vaginal Bleeding   Lori Moses is a 23 y.o. G1P0 at [redacted]w[redacted]d who presents to MAU with complaints of vaginal bleeding. She reports going for a walk for about an hour, once she got home and used the restroom she notices blood in her underwear. She describes it is as small dark brown smear in her underwear. She denies continued vaginal bleeding or discharge, denies recent IC. Reports pelvic pressure and mild abdominal cramps but denies contractions.Rates pain 2/10- has not taken any medication for pressure or cramping. +FM. She receives prenatal care at [facility] and next appt scheduled for 9/8. Patient denies complications during this pregnancy.    OB History    Gravida  1   Para  0   Term  0   Preterm  0   AB  0   Living  0     SAB  0   TAB  0   Ectopic  0   Multiple  0   Live Births              Past Medical History:  Diagnosis Date  . Acid reflux   . Anemia   . H/O repair of patent ductus arteriosus   . PID (acute pelvic inflammatory disease) 05/2017    Past Surgical History:  Procedure Laterality Date  . PATENT DUCTUS ARTERIOUS REPAIR     as an infant    Family History  Problem Relation Age of Onset  . Arthritis Mother   . Arthritis Maternal Grandmother   . Breast cancer Paternal Grandmother   . Down syndrome Sister   . Diabetes Maternal Uncle     Social History   Tobacco Use  . Smoking status: Never Smoker  . Smokeless tobacco: Never Used  Substance Use Topics  . Alcohol use: No  . Drug use: No    Allergies:  Allergies  Allergen Reactions  . Flagyl [Metronidazole] Hives  . Other Hives    potatoes  . Penicillins Hives    Has patient had a PCN reaction causing immediate rash, facial/tongue/throat swelling, SOB or lightheadedness with hypotension: Yes Has patient  had a PCN reaction causing severe rash involving mucus membranes or skin necrosis: No Has patient had a PCN reaction that required hospitalization No Has patient had a PCN reaction occurring within the last 10 years: No If all of the above answers are "NO", then may proceed with Cephalosporin use.     Medications Prior to Admission  Medication Sig Dispense Refill Last Dose  . Prenatal Vit-Fe Fumarate-FA (PRENATAL COMPLETE) 14-0.4 MG TABS Take 1 tablet by mouth daily. 60 each 3 08/02/2019 at Unknown time  . polyethylene glycol (MIRALAX) 17 g packet Take 17 g by mouth daily. 14 each 0     Review of Systems  Constitutional: Negative.   Respiratory: Negative.   Cardiovascular: Negative.   Gastrointestinal: Positive for abdominal pain. Negative for constipation, diarrhea, nausea and vomiting.       Lower abdominal cramping and pressure  Genitourinary: Positive for vaginal bleeding. Negative for difficulty urinating, dysuria, frequency and vaginal discharge.  Musculoskeletal: Negative.   Neurological: Negative.    Physical Exam   Blood pressure 110/72, pulse (!) 103, temperature 98.3 F (36.8 C), temperature source Oral, resp. rate 16, height 5\' 7"  (1.702  m), weight 76.7 kg, last menstrual period 01/03/2019.  Physical Exam  Nursing note and vitals reviewed. Constitutional: She is oriented to person, place, and time. She appears well-developed and well-nourished. No distress.  Cardiovascular: Normal rate and regular rhythm.  Respiratory: Effort normal and breath sounds normal. No respiratory distress. She has no wheezes. She has no rales.  GI: Soft. There is no abdominal tenderness. There is no rebound and no guarding.  Gravid appropriate for gestational age, vertex position by leopolds   Genitourinary:    Vaginal discharge present.     No vaginal bleeding.  No bleeding in the vagina.    Genitourinary Comments: Pelvic exam: Cervix pink, visually closed, without lesion, moderate amount of  white creamy discharge, NO vaginal bleeding on examination, vaginal walls and external genitalia normal   Musculoskeletal: Normal range of motion.        General: No edema.  Neurological: She is alert and oriented to person, place, and time.  Psychiatric: She has a normal mood and affect. Her behavior is normal. Thought content normal.   Dilation: Closed Effacement (%): Thick Cervical Position: Posterior Presentation: Vertex Exam by:: Wende Bushy CNM  FHR: 150/moderate/+accels/ no decelerations  Toco: UI  MAU Course  Procedures  MDM FFN collected prior to examination- not sent, cervix C/L/P SSE: mod amount of discharge noted, no vaginal bleeding- wet prep collected  Wet prep pending  NST reactive and reassuring for gestational age   Labs reviewed:  Results for orders placed or performed during the hospital encounter of 08/03/19 (from the past 24 hour(s))  Urinalysis, Routine w reflex microscopic     Status: Abnormal   Collection Time: 08/03/19  7:40 PM  Result Value Ref Range   Color, Urine YELLOW YELLOW   APPearance HAZY (A) CLEAR   Specific Gravity, Urine 1.026 1.005 - 1.030   pH 6.0 5.0 - 8.0   Glucose, UA NEGATIVE NEGATIVE mg/dL   Hgb urine dipstick NEGATIVE NEGATIVE   Bilirubin Urine NEGATIVE NEGATIVE   Ketones, ur NEGATIVE NEGATIVE mg/dL   Protein, ur NEGATIVE NEGATIVE mg/dL   Nitrite NEGATIVE NEGATIVE   Leukocytes,Ua MODERATE (A) NEGATIVE   RBC / HPF 0-5 0 - 5 RBC/hpf   WBC, UA 11-20 0 - 5 WBC/hpf   Bacteria, UA FEW (A) NONE SEEN   Squamous Epithelial / LPF 11-20 0 - 5   Mucus PRESENT   Wet prep, genital     Status: Abnormal   Collection Time: 08/03/19  8:28 PM   Specimen: Thin Prep Cervical/Endocervical  Result Value Ref Range   Yeast Wet Prep HPF POC NONE SEEN NONE SEEN   Trich, Wet Prep NONE SEEN NONE SEEN   Clue Cells Wet Prep HPF POC NONE SEEN NONE SEEN   WBC, Wet Prep HPF POC MODERATE (A) NONE SEEN   Sperm NONE SEEN    Urine culture pending. GC/C  pending. Wet prep negative.  Patient reports drinking 2-3 bottles of water per day, encouraged to increase amount of water consumption to 6-8 bottles per day. Encouraged patient to walk 20-30 minutes and continue to hydrate throughout walking. If she starts to cramp during walking like today, stop walking, rest and hydrate- patient verbalizes understanding.   Discussed reasons to return to MAU, follow up as scheduled in the office for prenatal care. Pt stable at time of discharge.   Assessment and Plan   1. [redacted] weeks gestation of pregnancy   2. NST (non-stress test) reactive    Discharge home Follow up  as scheduled for prenatal appointments  [facility]   Follow-up Information    [facility]. Go on 08/14/2019.   Specialty: Obstetrics and Gynecology Why: Follow up as scheduled and return to MAU as needed for emergencies  Contact information: [address] [place] [phone]         Allergies as of 08/03/2019      Reactions   Flagyl [metronidazole] Hives   Other Hives   potatoes   Penicillins Hives   Has patient had a PCN reaction causing immediate rash, facial/tongue/throat swelling, SOB or lightheadedness with hypotension: Yes Has patient had a PCN reaction causing severe rash involving mucus membranes or skin necrosis: No Has patient had a PCN reaction that required hospitalization No Has patient had a PCN reaction occurring within the last 10 years: No If all of the above answers are "NO", then may proceed with Cephalosporin use.      Medication List    TAKE these medications   polyethylene glycol 17 g packet Commonly known as: MiraLax Take 17 g by mouth daily.   Prenatal Complete 14-0.4 MG Tabs Take 1 tablet by mouth daily.       Sharyon CableVeronica C Cadi Rhinehart CNM 08/03/2019, 10:39 PM

## 2019-08-03 NOTE — MAU Note (Signed)
Pt spent an hour walking outside today. States she had brown spotting and low abd cramping for an hour around 1700.  No further spotting or cramping, just groin pressure. Feels baby moving well.  Called her office and they suggested she come in.

## 2019-08-05 LAB — CULTURE, OB URINE

## 2019-08-07 LAB — GC/CHLAMYDIA PROBE AMP (~~LOC~~) NOT AT ARMC
Chlamydia: NEGATIVE
Neisseria Gonorrhea: NEGATIVE

## 2019-08-29 DIAGNOSIS — O26859 Spotting complicating pregnancy, unspecified trimester: Secondary | ICD-10-CM | POA: Diagnosis not present

## 2019-08-30 DIAGNOSIS — Z3A34 34 weeks gestation of pregnancy: Secondary | ICD-10-CM | POA: Diagnosis not present

## 2019-08-30 DIAGNOSIS — Z3493 Encounter for supervision of normal pregnancy, unspecified, third trimester: Secondary | ICD-10-CM | POA: Diagnosis not present

## 2019-08-30 DIAGNOSIS — Z23 Encounter for immunization: Secondary | ICD-10-CM | POA: Diagnosis not present

## 2019-09-29 ENCOUNTER — Inpatient Hospital Stay (HOSPITAL_COMMUNITY)
Admission: AD | Admit: 2019-09-29 | Discharge: 2019-09-29 | Disposition: A | Discharge status: Home / Self Care | Payer: Medicaid Other | Attending: Obstetrics and Gynecology | Admitting: Obstetrics and Gynecology

## 2019-09-29 ENCOUNTER — Other Ambulatory Visit: Payer: Self-pay

## 2019-09-29 ENCOUNTER — Encounter (HOSPITAL_COMMUNITY): Payer: Self-pay | Admitting: *Deleted

## 2019-09-29 DIAGNOSIS — Z3689 Encounter for other specified antenatal screening: Secondary | ICD-10-CM | POA: Diagnosis not present

## 2019-09-29 DIAGNOSIS — Z3A38 38 weeks gestation of pregnancy: Secondary | ICD-10-CM | POA: Diagnosis not present

## 2019-09-29 DIAGNOSIS — Z0371 Encounter for suspected problem with amniotic cavity and membrane ruled out: Secondary | ICD-10-CM | POA: Diagnosis not present

## 2019-09-29 DIAGNOSIS — N898 Other specified noninflammatory disorders of vagina: Secondary | ICD-10-CM | POA: Diagnosis present

## 2019-09-29 LAB — URINALYSIS, ROUTINE W REFLEX MICROSCOPIC
Bilirubin Urine: NEGATIVE
Glucose, UA: NEGATIVE mg/dL
Hgb urine dipstick: NEGATIVE
Ketones, ur: NEGATIVE mg/dL
Leukocytes,Ua: NEGATIVE
Nitrite: NEGATIVE
Protein, ur: NEGATIVE mg/dL
Specific Gravity, Urine: 1.014 (ref 1.005–1.030)
pH: 6 (ref 5.0–8.0)

## 2019-09-29 NOTE — MAU Provider Note (Signed)
S: [name] is a 23 y.o. G1P0000 at [redacted]w[redacted]d  who presents to MAU today complaining of leaking of fluid. She denies vaginal bleeding. She endorses contractions. She reports normal fetal movement.    O: BP 114/70 (BP Location: Right Arm)   Pulse 97   Temp 98.1 F (36.7 C) (Oral)   Resp 18   Ht 5\' 7"  (1.702 m)   Wt 80.7 kg   LMP 01/03/2019   SpO2 100%   BMI 27.86 kg/m  GENERAL: Well-developed, well-nourished female in no acute distress.  HEAD: Normocephalic, atraumatic.  CHEST: Normal effort of breathing, regular heart rate ABDOMEN: Soft, nontender, gravid PELVIC: Normal external female genitalia. Vagina is pink and rugated. Cervix with normal contour, no lesions. Normal discharge.  Negative pooling. [place].  Cervical exam:  Dilation: Closed Effacement (%): 0, 50 Presentation: Vertex Exam by:: J.Jalyiah Shelley,CNM   Fetal Monitoring: FHT: 155 bpm, Mod Var, -Decels, +Accels Toco: Occasional Irritability  No results found for this or any previous visit (from the past 24 hour(s)). Fern negative  A: SIUP at [redacted]w[redacted]d  Membranes intact  Cat I FT  P: Fern Negative NST Reactive Discharge to home with precautions.  Patient to follow up with primary ob as scheduled.    Gavin Pound, CNM 09/29/2019 2:21 AM

## 2019-09-29 NOTE — MAU Note (Addendum)
Pt reports frequent urination since 9pm. Feels like she is going every 5 mins. She denies burning or pain with urination, just pressure when she needs to go to bathroom. Pt unsure if her water broke because she had "a big gush" of clear fluid that "didn't look like pee". Pt also reports pelvic pressure all day and leg cramping x1 month. Pt denies vaginal bleeding. Reports good fetal movement.

## 2019-09-29 NOTE — Discharge Instructions (Signed)

## 2019-10-05 ENCOUNTER — Encounter (HOSPITAL_COMMUNITY): Payer: Self-pay

## 2019-10-05 ENCOUNTER — Inpatient Hospital Stay (HOSPITAL_COMMUNITY)
Admission: AD | Admit: 2019-10-05 | Discharge: 2019-10-05 | Disposition: A | Discharge status: Home / Self Care | Payer: Medicaid Other | Attending: Obstetrics and Gynecology | Admitting: Obstetrics and Gynecology

## 2019-10-05 ENCOUNTER — Other Ambulatory Visit: Payer: Self-pay

## 2019-10-05 DIAGNOSIS — Z3689 Encounter for other specified antenatal screening: Secondary | ICD-10-CM | POA: Diagnosis not present

## 2019-10-05 DIAGNOSIS — O471 False labor at or after 37 completed weeks of gestation: Secondary | ICD-10-CM | POA: Insufficient documentation

## 2019-10-05 DIAGNOSIS — Z3A39 39 weeks gestation of pregnancy: Secondary | ICD-10-CM | POA: Insufficient documentation

## 2019-10-05 NOTE — MAU Note (Addendum)
Contracting every 7 min, some spotting last night. No LOF.  +FM. Was 1 cm when last checked.

## 2019-10-05 NOTE — Discharge Instructions (Signed)

## 2019-10-14 IMAGING — US US TRANSVAGINAL NON-OB
1 series · 15 of 25 positions shown · non-contrast
Comparison: 05/17/2017

CLINICAL DATA: Pelvic pain

EXAM:
TRANSABDOMINAL AND TRANSVAGINAL ULTRASOUND OF PELVIS
TECHNIQUE: Both transabdominal and transvaginal ultrasound examinations of the
pelvis were performed. Transabdominal technique was performed for
global imaging of the pelvis including uterus, ovaries, adnexal
regions, and pelvic cul-de-sac. It was necessary to proceed with
endovaginal exam following the transabdominal exam to visualize the
uterus, endometrium, ovaries and adnexa .

[Series 1: us transvaginal non-ob · 15 of 57 slices shown]
[im 1/57]
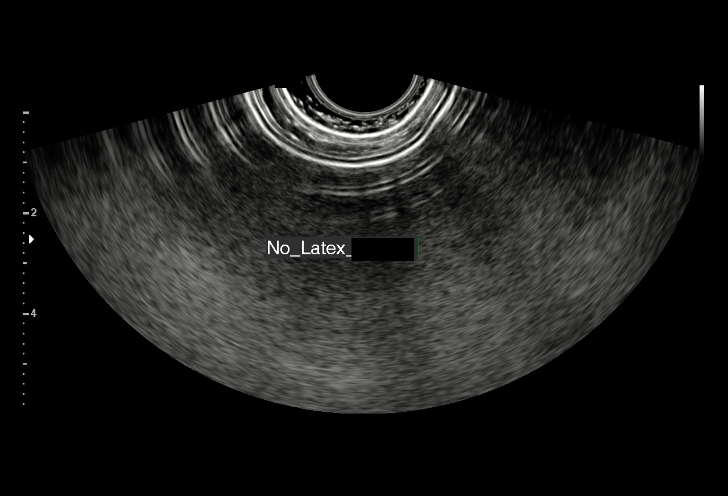
[im 5/57]
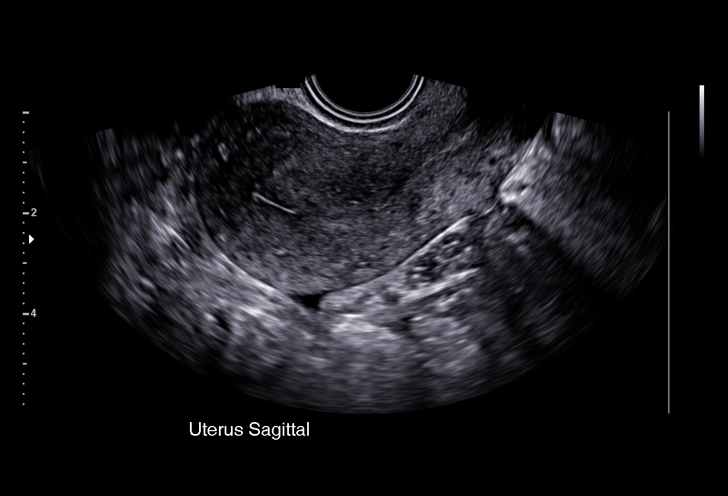
[im 10/57]
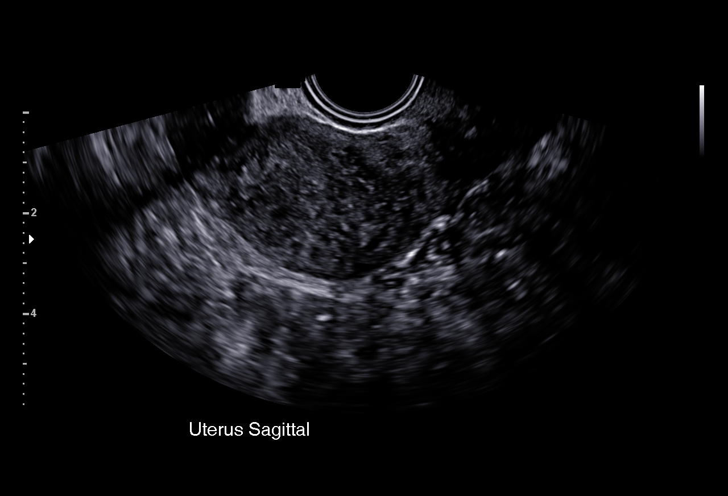
[im 12/57]
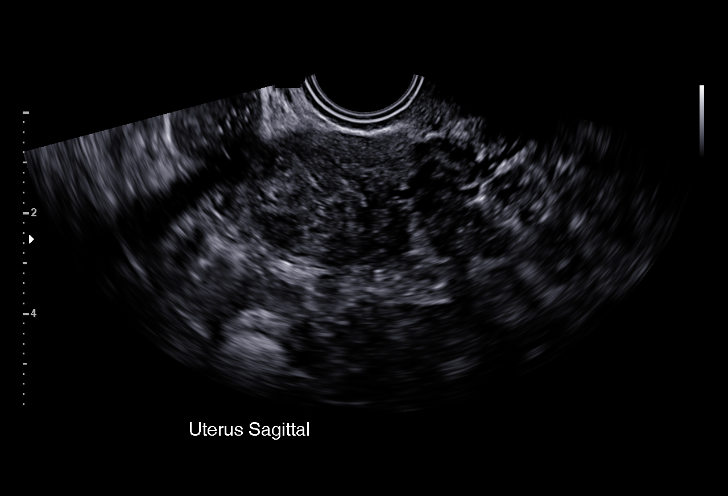
[im 17/57]
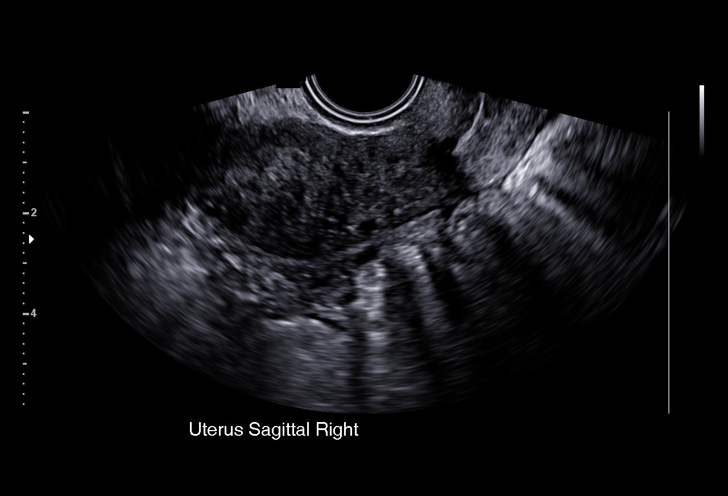
[im 22/57]
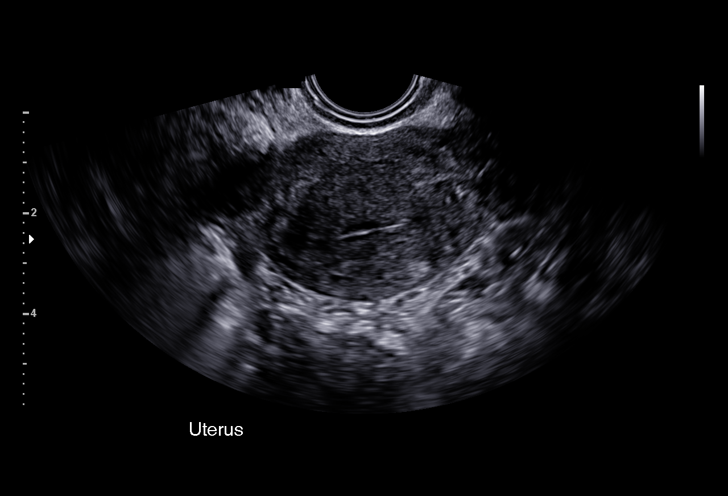
[im 24/57]
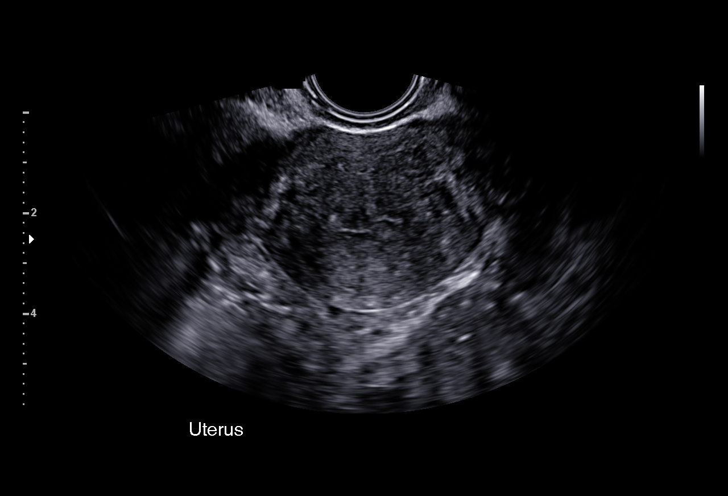
[im 29/57]
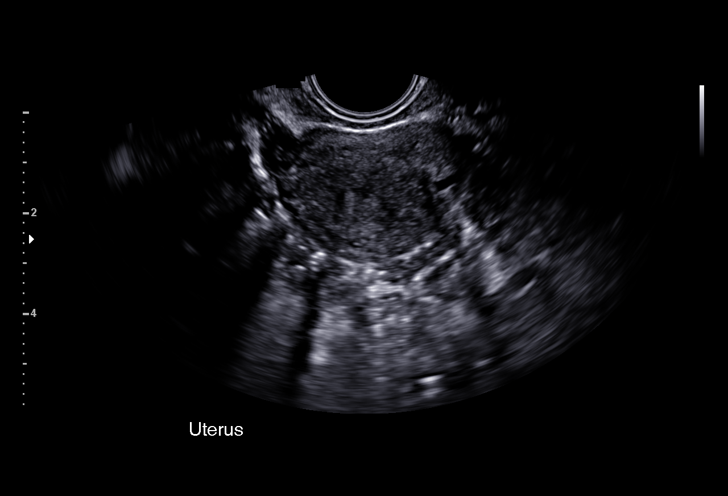
[im 33/57]
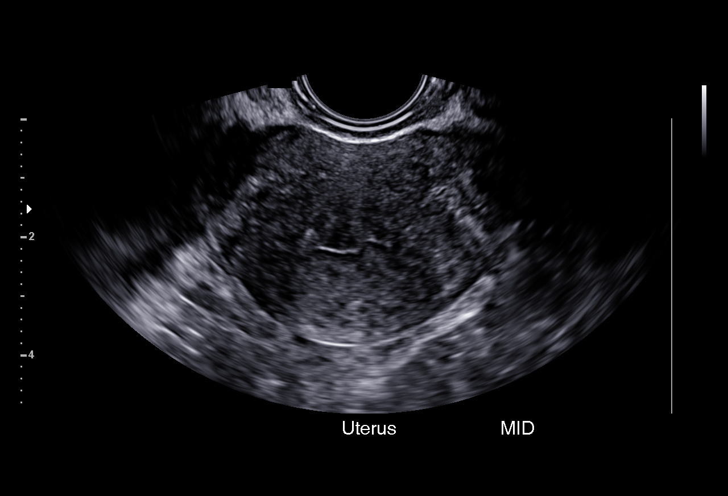
[im 36/57]
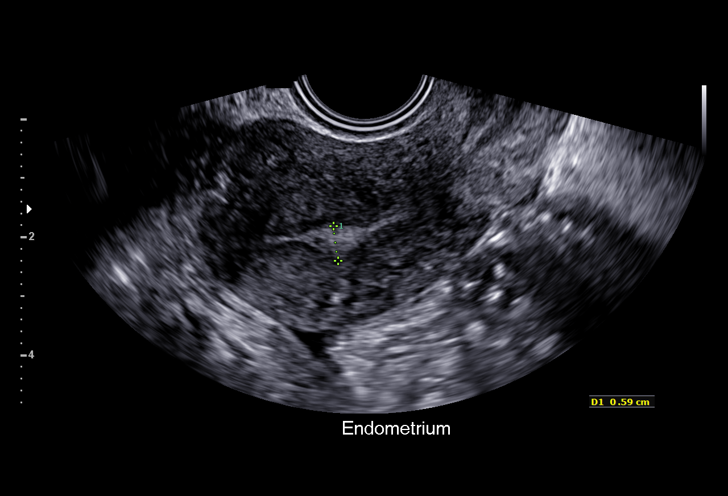
[im 40/57]
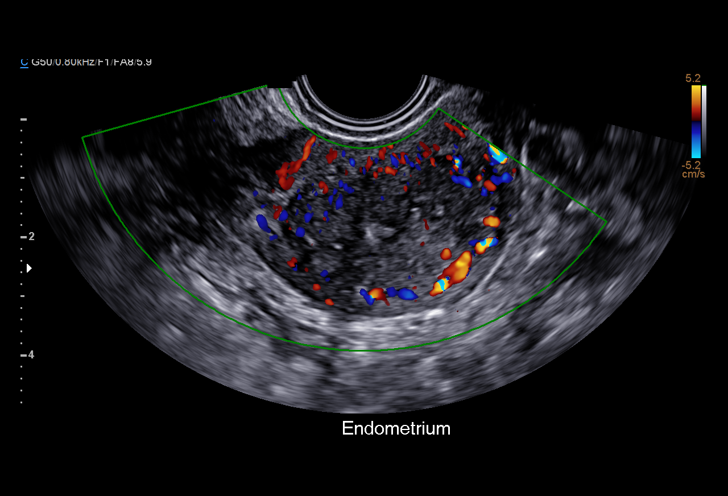
[im 45/57]
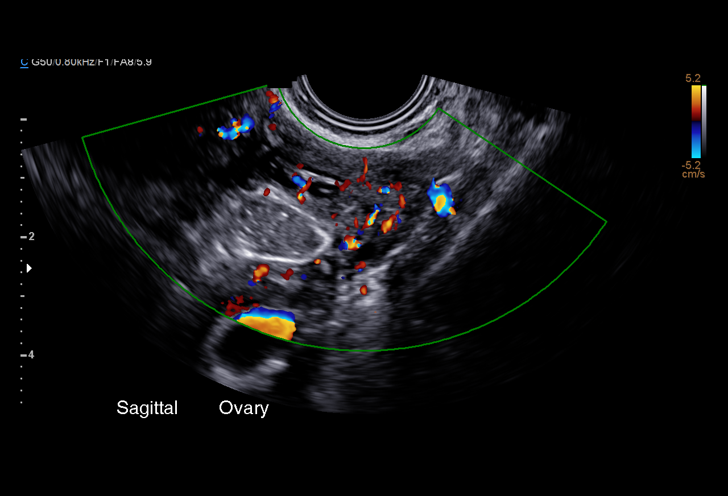
[im 47/57]
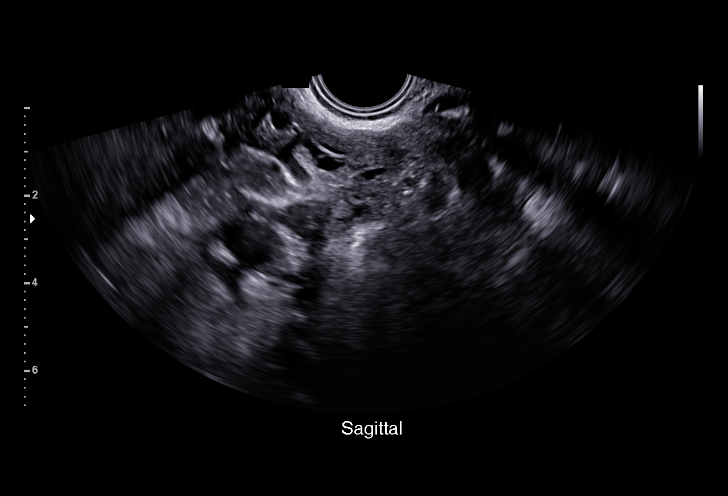
[im 52/57]
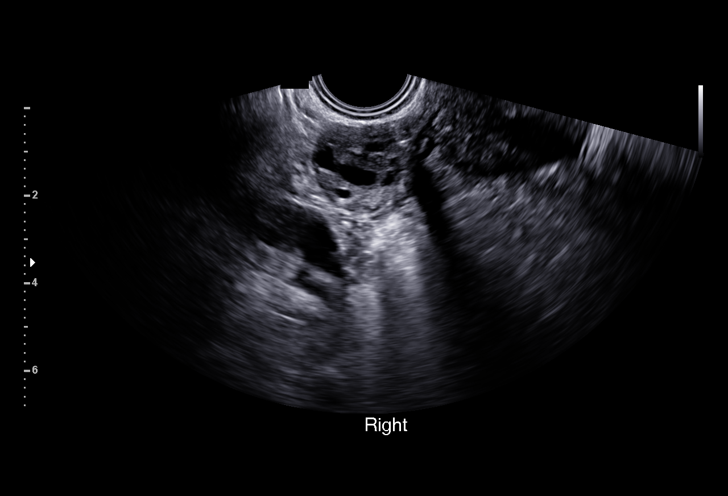
[im 57/57]
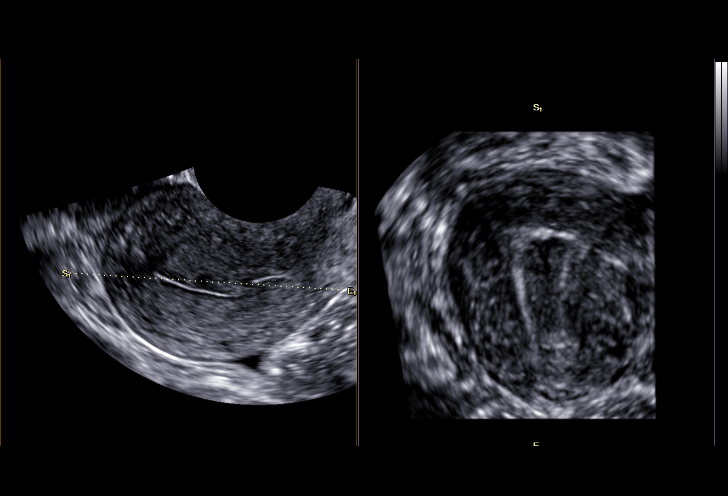

[15 of 25 positions shown; findings below may reference images not displayed]

FINDINGS: Uterus

Measurements: 7.6 x 4.2 x 5.1 cm. No fibroids or other mass
visualized.

Endometrium

Thickness: 6 mm in thickness.  No focal abnormality visualized.

Right ovary

Measurements: 3.3 x 2.3 x 2.2 cm. Normal appearance/no adnexal mass.

Left ovary

Measurements: 2.9 x 2.4 x 2.2 cm. Normal appearance/no adnexal mass.

Other findings

Trace free fluid in the pelvis, likely physiologic
IMPRESSION: Normal pelvic ultrasound.

## 2019-10-15 ENCOUNTER — Encounter (HOSPITAL_COMMUNITY): Payer: Self-pay | Admitting: *Deleted

## 2019-10-15 ENCOUNTER — Telehealth (HOSPITAL_COMMUNITY): Payer: Self-pay | Admitting: *Deleted

## 2019-10-15 ENCOUNTER — Other Ambulatory Visit: Payer: Self-pay | Admitting: Obstetrics and Gynecology

## 2019-10-15 DIAGNOSIS — N858 Other specified noninflammatory disorders of uterus: Secondary | ICD-10-CM | POA: Diagnosis not present

## 2019-10-15 DIAGNOSIS — Z3402 Encounter for supervision of normal first pregnancy, second trimester: Secondary | ICD-10-CM | POA: Diagnosis not present

## 2019-10-15 DIAGNOSIS — Z3A41 41 weeks gestation of pregnancy: Secondary | ICD-10-CM | POA: Diagnosis not present

## 2019-10-15 NOTE — Telephone Encounter (Signed)
Preadmission screen  

## 2019-10-16 ENCOUNTER — Other Ambulatory Visit: Payer: Self-pay

## 2019-10-16 ENCOUNTER — Other Ambulatory Visit (HOSPITAL_COMMUNITY)
Admission: RE | Admit: 2019-10-16 | Discharge: 2019-10-16 | Disposition: A | Discharge status: Home / Self Care | Payer: Medicaid Other | Source: Ambulatory Visit | Attending: Obstetrics and Gynecology | Admitting: Obstetrics and Gynecology

## 2019-10-16 DIAGNOSIS — Z01812 Encounter for preprocedural laboratory examination: Secondary | ICD-10-CM | POA: Diagnosis not present

## 2019-10-16 DIAGNOSIS — Z20828 Contact with and (suspected) exposure to other viral communicable diseases: Secondary | ICD-10-CM | POA: Insufficient documentation

## 2019-10-16 LAB — SARS CORONAVIRUS 2 (TAT 6-24 HRS): SARS Coronavirus 2: NEGATIVE

## 2019-10-16 NOTE — MAU Note (Signed)
Asymptomatic, swab collected. 

## 2019-10-18 ENCOUNTER — Inpatient Hospital Stay (HOSPITAL_COMMUNITY)
Admission: AD | Admit: 2019-10-18 | Discharge: 2019-10-20 | DRG: 807 | Disposition: A | Discharge status: Home / Self Care | Payer: Medicaid Other | Attending: Obstetrics & Gynecology | Admitting: Obstetrics & Gynecology

## 2019-10-18 ENCOUNTER — Inpatient Hospital Stay (HOSPITAL_COMMUNITY): Payer: Medicaid Other

## 2019-10-18 ENCOUNTER — Other Ambulatory Visit: Payer: Self-pay

## 2019-10-18 ENCOUNTER — Encounter (HOSPITAL_COMMUNITY): Payer: Self-pay

## 2019-10-18 DIAGNOSIS — O9081 Anemia of the puerperium: Secondary | ICD-10-CM | POA: Diagnosis not present

## 2019-10-18 DIAGNOSIS — O480 Post-term pregnancy: Principal | ICD-10-CM | POA: Diagnosis present

## 2019-10-18 DIAGNOSIS — B951 Streptococcus, group B, as the cause of diseases classified elsewhere: Secondary | ICD-10-CM | POA: Diagnosis present

## 2019-10-18 DIAGNOSIS — Z3490 Encounter for supervision of normal pregnancy, unspecified, unspecified trimester: Secondary | ICD-10-CM | POA: Diagnosis present

## 2019-10-18 DIAGNOSIS — Z3A41 41 weeks gestation of pregnancy: Secondary | ICD-10-CM | POA: Diagnosis not present

## 2019-10-18 DIAGNOSIS — N898 Other specified noninflammatory disorders of vagina: Secondary | ICD-10-CM

## 2019-10-18 DIAGNOSIS — O99824 Streptococcus B carrier state complicating childbirth: Secondary | ICD-10-CM | POA: Diagnosis present

## 2019-10-18 LAB — RPR: RPR Ser Ql: NONREACTIVE

## 2019-10-18 LAB — CBC
HCT: 31.4 % — ABNORMAL LOW (ref 36.0–46.0)
Hemoglobin: 10.3 g/dL — ABNORMAL LOW (ref 12.0–15.0)
MCH: 26.9 pg (ref 26.0–34.0)
MCHC: 32.8 g/dL (ref 30.0–36.0)
MCV: 82 fL (ref 80.0–100.0)
Platelets: 131 10*3/uL — ABNORMAL LOW (ref 150–400)
RBC: 3.83 MIL/uL — ABNORMAL LOW (ref 3.87–5.11)
RDW: 14.4 % (ref 11.5–15.5)
WBC: 6 10*3/uL (ref 4.0–10.5)
nRBC: 0 % (ref 0.0–0.2)

## 2019-10-18 LAB — TYPE AND SCREEN
ABO/RH(D): O POS
Antibody Screen: NEGATIVE

## 2019-10-18 MED ORDER — SIMETHICONE 80 MG PO CHEW: 80.0000 mg | CHEWABLE_TABLET | ORAL | Status: DC | PRN

## 2019-10-18 MED ORDER — ACETAMINOPHEN 325 MG PO TABS
650.0000 mg | ORAL_TABLET | ORAL | Status: DC | PRN
  Administered 2019-10-19: 650 mg via ORAL
  Filled 2019-10-18 (×2): qty 2

## 2019-10-18 MED ORDER — ACETAMINOPHEN 325 MG PO TABS: 650.0000 mg | ORAL_TABLET | ORAL | Status: DC | PRN

## 2019-10-18 MED ORDER — OXYTOCIN BOLUS FROM INFUSION
500.0000 mL | Freq: Once | INTRAVENOUS | Status: AC
  Administered 2019-10-18: 500 mL via INTRAVENOUS

## 2019-10-18 MED ORDER — COCONUT OIL OIL: 1.0000 "application " | TOPICAL_OIL | Status: DC | PRN

## 2019-10-18 MED ORDER — MISOPROSTOL 25 MCG QUARTER TABLET
25.0000 ug | ORAL_TABLET | ORAL | Status: DC | PRN
  Administered 2019-10-18: 25 ug via VAGINAL
  Filled 2019-10-18 (×2): qty 1

## 2019-10-18 MED ORDER — BENZOCAINE-MENTHOL 20-0.5 % EX AERO
1.0000 "application " | INHALATION_SPRAY | CUTANEOUS | Status: DC | PRN
  Administered 2019-10-18: 1 via TOPICAL
  Filled 2019-10-18 (×2): qty 56

## 2019-10-18 MED ORDER — LIDOCAINE HCL (PF) 1 % IJ SOLN
30.0000 mL | INTRAMUSCULAR | Status: AC | PRN
  Administered 2019-10-18: 30 mL via SUBCUTANEOUS
  Filled 2019-10-18: qty 30

## 2019-10-18 MED ORDER — ONDANSETRON HCL 4 MG PO TABS: 4.0000 mg | ORAL_TABLET | ORAL | Status: DC | PRN

## 2019-10-18 MED ORDER — ONDANSETRON HCL 4 MG/2ML IJ SOLN: 4.0000 mg | Freq: Four times a day (QID) | INTRAMUSCULAR | Status: DC | PRN

## 2019-10-18 MED ORDER — PRENATAL MULTIVITAMIN CH
1.0000 | ORAL_TABLET | Freq: Every day | ORAL | Status: DC
  Administered 2019-10-19 – 2019-10-20 (×2): 1 via ORAL
  Filled 2019-10-18 (×2): qty 1

## 2019-10-18 MED ORDER — IBUPROFEN 600 MG PO TABS
600.0000 mg | ORAL_TABLET | Freq: Four times a day (QID) | ORAL | Status: DC
  Administered 2019-10-18: 14:00:00 600 mg via ORAL
  Filled 2019-10-18 (×3): qty 1

## 2019-10-18 MED ORDER — OXYCODONE HCL 5 MG PO TABS
5.0000 mg | ORAL_TABLET | ORAL | Status: DC | PRN
  Administered 2019-10-19 – 2019-10-20 (×4): 5 mg via ORAL
  Filled 2019-10-18 (×4): qty 1

## 2019-10-18 MED ORDER — OXYTOCIN 40 UNITS IN NORMAL SALINE INFUSION - SIMPLE MED: 2.5000 [IU]/h | INTRAVENOUS | Status: DC

## 2019-10-18 MED ORDER — TERBUTALINE SULFATE 1 MG/ML IJ SOLN: 0.2500 mg | Freq: Once | INTRAMUSCULAR | Status: DC | PRN

## 2019-10-18 MED ORDER — OXYCODONE-ACETAMINOPHEN 5-325 MG PO TABS: 2.0000 | ORAL_TABLET | ORAL | Status: DC | PRN

## 2019-10-18 MED ORDER — LACTATED RINGERS IV SOLN
INTRAVENOUS | Status: DC
  Administered 2019-10-18: 01:00:00 via INTRAVENOUS

## 2019-10-18 MED ORDER — SOD CITRATE-CITRIC ACID 500-334 MG/5ML PO SOLN: 30.0000 mL | ORAL | Status: DC | PRN

## 2019-10-18 MED ORDER — LACTATED RINGERS IV SOLN
500.0000 mL | INTRAVENOUS | Status: DC | PRN
  Administered 2019-10-18: 500 mL via INTRAVENOUS

## 2019-10-18 MED ORDER — FENTANYL CITRATE (PF) 100 MCG/2ML IJ SOLN
50.0000 ug | INTRAMUSCULAR | Status: DC | PRN
  Administered 2019-10-18 (×3): 100 ug via INTRAVENOUS
  Filled 2019-10-18 (×3): qty 2

## 2019-10-18 MED ORDER — VANCOMYCIN HCL IN DEXTROSE 1-5 GM/200ML-% IV SOLN
1000.0000 mg | Freq: Two times a day (BID) | INTRAVENOUS | Status: DC
  Administered 2019-10-18: 1000 mg via INTRAVENOUS
  Filled 2019-10-18 (×2): qty 200

## 2019-10-18 MED ORDER — OXYCODONE-ACETAMINOPHEN 5-325 MG PO TABS: 1.0000 | ORAL_TABLET | ORAL | Status: DC | PRN

## 2019-10-18 MED ORDER — DIPHENHYDRAMINE HCL 25 MG PO CAPS: 25.0000 mg | ORAL_CAPSULE | Freq: Four times a day (QID) | ORAL | Status: DC | PRN

## 2019-10-18 MED ORDER — DIBUCAINE (PERIANAL) 1 % EX OINT: 1.0000 "application " | TOPICAL_OINTMENT | CUTANEOUS | Status: DC | PRN

## 2019-10-18 MED ORDER — SENNOSIDES-DOCUSATE SODIUM 8.6-50 MG PO TABS
2.0000 | ORAL_TABLET | ORAL | Status: DC
  Filled 2019-10-18: qty 2

## 2019-10-18 MED ORDER — WITCH HAZEL-GLYCERIN EX PADS: 1.0000 "application " | MEDICATED_PAD | CUTANEOUS | Status: DC | PRN

## 2019-10-18 MED ORDER — TETANUS-DIPHTH-ACELL PERTUSSIS 5-2.5-18.5 LF-MCG/0.5 IM SUSP: 0.5000 mL | Freq: Once | INTRAMUSCULAR | Status: DC

## 2019-10-18 MED ORDER — CLINDAMYCIN PHOSPHATE 900 MG/50ML IV SOLN
900.0000 mg | Freq: Three times a day (TID) | INTRAVENOUS | Status: DC
  Filled 2019-10-18 (×2): qty 50

## 2019-10-18 MED ORDER — LIDOCAINE HCL (PF) 1 % IJ SOLN
30.0000 mL | Freq: Once | INTRAMUSCULAR | Status: AC
  Administered 2019-10-18: 30 mL

## 2019-10-18 MED ORDER — OXYTOCIN 40 UNITS IN NORMAL SALINE INFUSION - SIMPLE MED
1.0000 m[IU]/min | INTRAVENOUS | Status: DC
  Administered 2019-10-18: 2 m[IU]/min via INTRAVENOUS
  Filled 2019-10-18 (×2): qty 1000

## 2019-10-18 MED ORDER — ONDANSETRON HCL 4 MG/2ML IJ SOLN: 4.0000 mg | INTRAMUSCULAR | Status: DC | PRN

## 2019-10-18 MED ORDER — ZOLPIDEM TARTRATE 5 MG PO TABS: 5.0000 mg | ORAL_TABLET | Freq: Every evening | ORAL | Status: DC | PRN

## 2019-10-18 MED ORDER — LIDOCAINE HCL (PF) 1 % IJ SOLN
INTRAMUSCULAR | Status: AC
  Filled 2019-10-18: qty 30

## 2019-10-18 NOTE — Lactation Note (Signed)
This note was copied from a baby's chart. Lactation Consultation Note  Patient Name: Lori Moses [id number] Date: 10/18/2019 Reason for consult: Initial assessment;Primapara;1st time breastfeeding;Term  8 hours old FT female who is being partially BF and formula fed by his mother, she's a P1. Mom reported (+) breast changes during the pregnancy. She participated in the [facility] program at the [facility] and she's already familiar with hand expression. Noticed that LATCH score done by RN Chloe shows that mom has flat nipples, but previous LATCH score states "everted nipples". Unable to observe mom's nipples since she declined help with hand expression. RN Westly Pam has been very proactive and already set mom up with a DEBP, no swallows noted on previous LATCH scores.  Offered assistance with latch but mom politely declined, baby was asleep on his bassinet. Asked mom to call for assistance when needed. Noticed a large volume of formula given on the last feeding, 15 ml. Mom had been given the supplementation guidelines for formula fed only babies instead of the one for breastfed babies; mom came as breast/formula. LC provided formula supplementation guidelines when feeding baby at the breast, mom voiced understanding. Reviewed normal newborn behavior, STS care, size of baby's stomach, cluster feeding and feeding cues.  Feeding plan:  1. Encouraged mom to feed baby STS 8-12 times/24 hours or sooner if feeding cues are present 2. Mom will try pumping after feedings and offer any amount of EBM she may get 3. She'll continue supplementing baby with [place] Gentle according to formula supplementation guidelines when supplementing at the breast per baby's age in hours   BF brochure, BF resources and feeding diary were reviewed. Mom and GOB reported all questions and concerns were answered, they're both aware of [facility] OP services and will call PRN.  Maternal Data Formula Feeding for Exclusion: Yes Reason for exclusion:  Mother's choice to formula and breast feed on admission Has patient been taught Hand Expression?: Yes Does the patient have breastfeeding experience prior to this delivery?: No  Feeding    LATCH Score                   Interventions Interventions: Breast feeding basics reviewed;DEBP  Lactation Tools Discussed/Used Tools: Pump Breast pump type: Double-Electric Breast Pump WIC Program: Yes Pump Review: Setup, frequency, and cleaning Initiated by:: RN Date initiated:: 10/18/19   Consult Status Consult Status: Follow-up Date: 10/19/19 Follow-up type: In-patient    Lori Moses 10/18/2019, 7:56 PM

## 2019-10-18 NOTE — H&P (Signed)
OB ADMISSION/ HISTORY & PHYSICAL:  Admission Date: 10/18/2019 12:10 AM  Admit Diagnosis: IOL for post date IUP  Lori Moses is a 23 y.o. female G1P0000 [redacted]w[redacted]d presenting for IOL. Endorses active FM, denies LOF and vaginal bleeding. Does not perceive ctx.   History of current pregnancy: G1P0000   Patient entered care with [facility] at 11w 5d   EDC of 10/10/19 was established by US/LMP and congruent w/ 8 w 6d U/S.   Anatomy scan:  19w 6d, with normal findings and anterior placenta.   Fetal echo for maternal hx of congenital heart disease (PDA repaired as an infant) Normal findings on fetal echo  Significant prenatal events: GBS positive in urine @ NOB visit Patient Active Problem List   Diagnosis Date Noted  . Urinary frequency 04/24/2018  . Nausea and vomiting 03/13/2018  . Acid reflux 03/13/2018  . Encounter for planned induction of labor 03/13/2018  . Pelvic pain 11/04/2017  . Vaginal discharge 11/04/2017  . Chlamydia 09/20/2017    Prenatal Labs: ABO, Rh: --/--/O POS (11/12 0124) Antibody: NEG (11/12 0124) Rubella: Immune (04/02 0000)  RPR: Nonreactive (04/02 0000)  HBsAg: Negative (04/02 0000)  HIV: Non-reactive (04/02 0000)  GTT: passed GBS:  GBS in urine  GC/CHL: negative/negative Genetics: low risk female, Horizon normal Sickle cell/Hgb electrophoresis: African American Vaccines: Tdap current, declined influenza   OB History  Gravida Para Term Preterm AB Living  1 0 0 0 0 0  SAB TAB Ectopic Multiple Live Births  0 0 0 0      # Outcome Date GA Lbr Len/2nd Weight Sex Delivery Anes PTL Lv  1 Current             Medical / Surgical History: Past medical history:  Past Medical History:  Diagnosis Date  . Acid reflux   . Anemia   . H/O repair of patent ductus arteriosus   . PID (acute pelvic inflammatory disease) 05/2017    Past surgical history:  Past Surgical History:  Procedure Laterality Date  . PATENT DUCTUS ARTERIOUS REPAIR     as an infant   Family  History:  Family History  Problem Relation Age of Onset  . Arthritis Mother   . Arthritis Maternal Grandmother   . Breast cancer Paternal Grandmother   . Down syndrome Sister   . Diabetes Maternal Uncle     Social History:  reports that she has never smoked. She has never used smokeless tobacco. She reports that she does not drink alcohol or use drugs.  Allergies: Flagyl [metronidazole], Other, and Penicillins   Current Medications at time of admission:  Prior to Admission medications   Medication Sig Start Date End Date Taking? Authorizing Provider  polyethylene glycol (MIRALAX) 17 g packet Take 17 g by mouth daily. 05/15/19   Armando Reichert, CNM  Prenatal Vit-Fe Fumarate-FA (PRENATAL COMPLETE) 14-0.4 MG TABS Take 1 tablet by mouth daily. 02/10/19   Alene Mires, NP    Review of Systems: Constitutional: Negative   HENT: Negative   Eyes: Negative   Respiratory: Negative   Cardiovascular: Negative   Gastrointestinal: Negative  Genitourinary: negative for bloody show, negative for LOF   Musculoskeletal: Negative   Skin: Negative   Neurological: Negative   Endo/Heme/Allergies: Negative   Psychiatric/Behavioral: Negative    Physical Exam: VS: Blood pressure 125/81, pulse 92, resp. rate 18, last menstrual period 01/03/2019. AAO x3, no signs of distress Cardiovascular: RRR Respiratory: Lung fields clear to ausculation GU/GI: Abdomen gravid, non-tender,  non-distended, active FM, vertex and 7# 8oz per Leopold's Extremities: +1 non-pitting edema, negative for pain, tenderness, and cords  Cervical exam:Dilation: 2 Effacement (%): 50 Station: -3 Exam by:: Vivan Grice CNM FHR: baseline rate 135 / variability moderate / accelerations present / absent decelerations TOCO: irregular, mild per palpation   Prenatal Transfer Tool  Maternal Diabetes: No Genetic Screening: Normal Maternal Ultrasounds/Referrals: Other: Fetal Ultrasounds or other Referrals:  Fetal  echo Maternal Substance Abuse:  No Significant Maternal Medications:  None Significant Maternal Lab Results: Group B Strep positive    Assessment: 23 y.o. G1P0000 [redacted]w[redacted]d IOL for post dates FHR category 1 GBS in urine in 1st trimester     -treat w/ Vanc in active labor Pain management: plans epidural   Plan:  Admit to L&D Routine admission orders Epidural PRN  Dr Mancel Bale notified of admission and plan of care  Arrie Eastern CNM, MSN 10/18/2019 3:42 AM

## 2019-10-18 NOTE — Progress Notes (Signed)
Tracing reviewed remotely cat 1 ?

## 2019-10-19 LAB — CBC
HCT: 26.9 % — ABNORMAL LOW (ref 36.0–46.0)
Hemoglobin: 8.5 g/dL — ABNORMAL LOW (ref 12.0–15.0)
MCH: 26.6 pg (ref 26.0–34.0)
MCHC: 31.6 g/dL (ref 30.0–36.0)
MCV: 84.1 fL (ref 80.0–100.0)
Platelets: 146 10*3/uL — ABNORMAL LOW (ref 150–400)
RBC: 3.2 MIL/uL — ABNORMAL LOW (ref 3.87–5.11)
RDW: 14.3 % (ref 11.5–15.5)
WBC: 7.9 10*3/uL (ref 4.0–10.5)
nRBC: 0 % (ref 0.0–0.2)

## 2019-10-19 NOTE — Progress Notes (Signed)
PPD# 1 SVD w/ 2nd degree and periurethral lacerations Information for the patient's newborn:  Colleen, Kotlarz [[id number]]  female    Baby Name LeGend Circumcision Out-patient w/ [facility]   S:   Reports feeling tired and uncomfortable Tolerating PO fluid and solids No nausea or vomiting Bleeding is light States poor pain control w/ PO meds, but refuses all PO med and topical options including ice packs. Advised peri-bottle, ice packs and Ibuprofen   Up ad lib / ambulatory / voiding w/o difficulty Feeding: Bottle    O:   VS: BP 112/70 (BP Location: Right Arm)   Pulse 92   Temp 98.7 F (37.1 C) (Oral)   Resp 16   LMP 01/03/2019   SpO2 100%   Breastfeeding Unknown   LABS:  Recent Labs    10/18/19 0124  WBC 6.0  HGB 10.3*  PLT 131*   Blood type: --/--/O POS (11/12 0124) Rubella: Immune (04/02 0000)                      I&O: Intake/Output      11/12 0701 - 11/13 0700   Blood 437   Total Output 437   Net -437         Physical Exam: Alert and oriented X3 Lungs: Clear and unlabored Heart: regular rate and rhythm / no mumurs Abdomen: soft, non-tender, non-distended  Fundus: firm, non-tender, U-2 Perineum: edematous, approximated, healing Lochia: scant Extremities: Non-pitting edema to bilat feet, no calf pain or tenderness    A:  PPD # 1  Normal exam  P:  Routine post partum orders Poor pain control    -Advised topicals, PO meds, and ice packs.  Anticipate D/C on 10/20/19   Arrie Eastern, MSN, CNM 10/19/2019, 6:34 AM

## 2019-10-19 NOTE — Progress Notes (Signed)
Pt states that she is having 10/10 pain in area where laceration is. This nurse offered pt pain medication. Pt stated " I don't want anything because it's not working anyway". Informed pt that she had different options for pain control and we can keep trying until we get her pain level under control. Pt continued to refuse all medication. Offered pt ice packs, pt refuse ice packs as well.  Will continue to assess pt, and encourage interventions to help with pain control.

## 2019-10-19 NOTE — Lactation Note (Signed)
This note was copied from a baby's chart. Lactation Consultation Note  Patient Name: Lori Moses [id number] Date: 10/19/2019 Reason for consult: Follow-up assessment;Difficult latch Baby is 27 hours/3% weight loss. Baby has had difficulty with latch and receiving formula supplementation.  When I arrived RN was assisting mom with latch attempt.  Baby very sleepy and showing no interest in feeding.  Mom and grandmother stated that baby was awake and cueing but they could not figure out how to reach a Optometrist.  I explained how to notify the nurses station so they could contact us.  I apologized for a delay.  Baby is sleeping skin to skin in cradle hold.  Waking techniques done.  Mom would like to position baby in cross cradle hold and doesn't like football hold.  Nipples are flat.  Baby not opening mouth or show interest in feeding.  24 mm nipple shield applied but no suck elicited.  Mom has pumped twice with DEBP.  She stopped pumping because nothing obtained.  Recommended she continue pumping every 3 hours to establish and maintain her milk supply.  Grandmother supportive.  Encouraged to call for assist prn.  Maternal Data    Feeding Feeding Type: Breast Fed  LATCH Score Latch: Too sleepy or reluctant, no latch achieved, no sucking elicited.  Audible Swallowing: None  Type of Nipple: Flat  Comfort (Breast/Nipple): Soft / non-tender  Hold (Positioning): Assistance needed to correctly position infant at breast and maintain latch.  LATCH Score: 4  Interventions    Lactation Tools Discussed/Used Tools: Nipple Shields Nipple shield size: 24   Consult Status Consult Status: Follow-up Date: 10/20/19 Follow-up type: In-patient    Ave Filter 10/19/2019, 2:51 PM

## 2019-10-20 DIAGNOSIS — O9081 Anemia of the puerperium: Secondary | ICD-10-CM | POA: Diagnosis not present

## 2019-10-20 MED ORDER — IBUPROFEN 600 MG PO TABS: 600.0000 mg | ORAL_TABLET | Freq: Four times a day (QID) | ORAL | 0 refills | Status: DC

## 2019-10-20 MED ORDER — ACETAMINOPHEN 325 MG PO TABS: 650.0000 mg | ORAL_TABLET | ORAL | 0 refills | Status: DC | PRN

## 2019-10-20 MED ORDER — POLYSACCHARIDE IRON COMPLEX 150 MG PO CAPS: 150.0000 mg | ORAL_CAPSULE | Freq: Every day | ORAL | 1 refills | Status: DC

## 2019-10-20 NOTE — Discharge Summary (Signed)
SVD OB Discharge Summary     Patient Name: Lori Moses DOB: 1996/11/18 MRN: [id number]  Date of admission: 10/18/2019 Delivering MD: Marlowe AltSPARACINO, HAILEY L  Date of delivery: 10/18/2019 Type of delivery: SVD  Newborn Data: Sex: Baby female Circumcision: out pt circ desired Live born female  Birth Weight: 8 lb 2.7 oz (3705 g) APGAR: 9, 9  Newborn Delivery   Birth date/time: 10/18/2019 11:07:00 Delivery type: Vaginal, Spontaneous      Feeding: breast and bottle Infant being discharge to home with mother in stable condition.   Admitting diagnosis: PREG Intrauterine pregnancy: 2475w1d     Secondary diagnosis:  Active Problems:   Encounter for planned induction of labor   Group beta Strep positive   Postpartum anemia   SVD (spontaneous vaginal delivery)                                Complications: None                                                              Intrapartum Procedures: spontaneous vaginal delivery and GBS prophylaxis Postpartum Procedures: none Complications-Operative and Postpartum: 2nd degree perineal laceration Augmentation: AROM, Pitocin and Cytotec   History of Present Illness: Lori Moses is a 23 y.o. female, G1P1001, who presents at 1975w1d weeks gestation. The patient has been [facility] at  [facility]  Her pregnancy has been complicated by:  Patient Active Problem List   Diagnosis Date Noted  . Postpartum anemia 10/20/2019  . SVD (spontaneous vaginal delivery) 10/20/2019  . Group beta Strep positive 10/18/2019  . Urinary frequency 04/24/2018  . Nausea and vomiting 03/13/2018  . Acid reflux 03/13/2018  . Encounter for planned induction of labor 03/13/2018  . Pelvic pain 11/04/2017  . Vaginal discharge 11/04/2017  . Chlamydia 09/20/2017    Hospital course:  Onset of Labor With Vaginal Delivery     23 y.o. yo G1P1001 at 2775w1d was admitted for IOL for postdates on 10/18/2019. Patient had an uncomplicated  labor course as follows:  Membrane Rupture Time/Date: 11:05 AM ,10/18/2019   Intrapartum Procedures: Episiotomy: None [1]                                         Lacerations:  Periurethral [8];2nd degree [3];Perineal [11]  Patient had a delivery of a Viable infant. 10/18/2019  Information for the patient's newborn:  Phineas InchesYoung, Boy Anadia [[id number]][[id number]]  Delivery Method: Vaginal, Spontaneous(Filed from Delivery Summary)     Pateint had an uncomplicated postpartum course.  She is ambulating, tolerating a regular diet, passing flatus, and urinating well. Patient is discharged home in stable condition on 10/20/19.  Postpartum Day # 2 : S/P NSVD due to IOL for posdates. Patient up ad lib, denies syncope or dizziness. Reports consuming regular diet without issues and denies N/V. Patient reports 0 bowel movement + passing flatus.  Denies issues with urination and reports bleeding is "lighter."  Patient is breast and bottlefeeding and reports going well.  Desires declines all form even after being educated for postpartum contraception.  Pain is being appropriately managed  with use of po meds.   Physical exam  Vitals:   10/19/19 0401 10/19/19 1529 10/19/19 2135 10/20/19 0605  BP: 112/70 109/75 119/74 107/74  Pulse: 92 79 86 96  Resp: 16 16 18 16   Temp: 98.7 F (37.1 C) 97.9 F (36.6 C) 98.1 F (36.7 C) 98 F (36.7 C)  TempSrc: Oral Oral Oral Oral  SpO2: 100%  98%    General: alert, cooperative and no distress Lochia: appropriate Uterine Fundus: firm Perineum: approximate, no hematomas noted DVT Evaluation: No evidence of DVT seen on physical exam. Negative Homan's sign. No cords or calf tenderness. No significant calf/ankle edema.  Labs: Lab Results  Component Value Date   WBC 7.9 10/19/2019   HGB 8.5 (L) 10/19/2019   HCT 26.9 (L) 10/19/2019   MCV 84.1 10/19/2019   PLT 146 (L) 10/19/2019   CMP Latest Ref Rng & Units 02/15/2019  Glucose 70 - 99 mg/dL 79  BUN 6 - 20 mg/dL 7  Creatinine  04/17/2019 - 7.09 mg/dL 6.28  Sodium 3.66 - 294 mmol/L 136  Potassium 3.5 - 5.1 mmol/L 3.9  Chloride 98 - 111 mmol/L 106  CO2 22 - 32 mmol/L 25  Calcium 8.9 - 10.3 mg/dL 9.3  Total Protein 6.5 - 8.1 g/dL 7.2  Total Bilirubin 0.3 - 1.2 mg/dL 1.0  Alkaline Phos 38 - 126 U/L 48  AST 15 - 41 U/L 17  ALT 0 - 44 U/L 13    Date of discharge: 10/20/2019 Discharge Diagnoses: Term Pregnancy-delivered Discharge instruction: per After Visit Summary and "Baby and Me Booklet".  After visit meds:   Activity:           unrestricted and pelvic rest Advance as tolerated. Pelvic rest for 6 weeks.  Diet:                routine Medications: PNV, Ibuprofen, Colace and Iron Postpartum contraception: Pt denlined all forms  Condition:  Pt discharge to home with baby in stable Anemia: Iron at home.   Meds: Allergies as of 10/20/2019      Reactions   Flagyl [metronidazole] Hives   Other Hives   potatoes   Penicillins Hives   Has patient had a PCN reaction causing immediate rash, facial/tongue/throat swelling, SOB or lightheadedness with hypotension: Yes Has patient had a PCN reaction causing severe rash involving mucus membranes or skin necrosis: No Has patient had a PCN reaction that required hospitalization No Has patient had a PCN reaction occurring within the last 10 years: No If all of the above answers are "NO", then may proceed with Cephalosporin use.      Medication List    TAKE these medications   acetaminophen 325 MG tablet Commonly known as: Tylenol Take 2 tablets (650 mg total) by mouth every 4 (four) hours as needed (for pain scale < 4).   ibuprofen 600 MG tablet Commonly known as: ADVIL Take 1 tablet (600 mg total) by mouth every 6 (six) hours.   iron polysaccharides 150 MG capsule Commonly known as: NIFEREX Take 1 capsule (150 mg total) by mouth daily.   Prenatal Complete 14-0.4 MG Tabs Take 1 tablet by mouth daily.       Discharge Follow Up:  Follow-up Information     [facility] Follow up.   Specialty: Obstetrics and Gynecology Why: 1 weeks female circ for baby and 6 weeks PPV Contact information: [address] [place] [phone]  [place], NP-C, CNM 10/20/2019, 12:40 PM  Noralyn Pick, [place]

## 2019-10-20 NOTE — Lactation Note (Signed)
This note was copied from a baby's chart. Lactation Consultation Note  Patient Name: Lori Moses [id number] Date: 10/20/2019 Reason for consult: Mother's request;Difficult latch;Follow-up assessment;1st time breastfeeding;Term;Infant weight loss  Lactation visit attempted, but pediatrician in room. Lactation to return later.  Matthias Hughs [facility] 10/20/2019, 9:08 AM

## 2019-10-20 NOTE — Lactation Note (Signed)
This note was copied from a baby's chart. Lactation Consultation Note  Patient Name: Lori Moses [id number] Date: 10/20/2019 Reason for consult: Mother's request;Difficult latch;Follow-up assessment;1st time breastfeeding;Term;Infant weight loss P1, 43 hour female infant with weight loss -8%. Tools given: hand pump, DEBP, 24 mm NS. Mom has been formula feeding,  per mom,  she breastfeed infant for 2 minutes at 9:45 pm and 2 minutes at 12 am infant will not sustain latch and pulls away from breast.  Mom recently increased formula intake with 3 feeding through out night of formula according flow sheet documentation. Per mom, she has not been consistent  with using DEBP due not seeing any colostrum coming out of the breast pump. LC explained colostrum is thick and  using the breast pump helps with breast  stimulation, induction of breast milk and LC encouraged mom to continue using DEBP, pump every 3 hours for 15 minutes both breast on initial setting. LC entered the room, infant was clothed , swallowed in blankets and was being held by grandmother. Per mom, she has been feeding infant fully clothed and noticing he has been going to sleep at breast. Mom has been using pacifier LC discussed risk of pacifier usage and mom will delay pacifier  until 3 or 4 weeks. Grand asked mom to pre-pump breast and mom latched infant on right breast using the football hold with 24 mm NS which was pre-filled 0.5 ml of Gerber gentle with iron, infant sustained latch for 15 minutes and swallows heard, LC notice colostrum in NS after infant latched  off breast. LC changed void and stool diaper and infant was still cuing to breastfeed, Mom re-latched infant and infant was still breastfeeding when [facility] left room.  Mom's current plan: 1. Mom will latch infant using 24 mm NS. 2. While mom pumps afterwards with DEBP,  grandmother will supplement infant with  EBM/ and or formula according to supplemental guidelines for infant's  age and hours of life. 3. Mom knows to cal Nurse or Justice if she needs assistance with latching infant to breast.     Maternal Data    Feeding Feeding Type: Breast Fed  LATCH Score Latch: Grasps breast easily, tongue down, lips flanged, rhythmical sucking.  Audible Swallowing: Spontaneous and intermittent  Type of Nipple: Flat  Comfort (Breast/Nipple): Soft / non-tender  Hold (Positioning): Assistance needed to correctly position infant at breast and maintain latch.  LATCH Score: 8  Interventions Interventions: Breast feeding basics reviewed;Breast compression;Assisted with latch;Adjust position;Support pillows;Skin to skin;DEBP;Hand pump;Position options;Breast massage;Hand express;Expressed milk;Pre-pump if needed;Shells  Lactation Tools Discussed/Used Tools: Pump(curve tip syringe given 0.5 ml of Gerber) Nipple shield size: 24(Mom has flat nipples)   Consult Status Consult Status: Follow-up Date: 10/20/19 Follow-up type: In-patient    Vicente Serene 10/20/2019, 7:35 AM

## 2019-10-20 NOTE — Lactation Note (Signed)
This note was copied from a baby's chart. Lactation Consultation Note  Mom's R breast is very full (she has not pumped since yesterday/last night). I attempted to get infant to latch successfully on the R breast with the size 24 nipple shield, but infant would only latch suboptimally. I tried to give tips to assist & different ideas for positioning, but Mom became frustrated.  Mom said she was going to try and latch infant to the L breast instead (much softer b/c infant had fed on that side recently). Mom attempted to manually stimulate her L nipple to get it more erect, but then asked me to prepare infant a bottle of formula, which I did.  Mom declined needing any more assistance from me   1140: I updated Faye Jegede, RN about the above. I asked Faye to remind Mom & MGM to wash nipple shield with dishwashing liquid & water after every use & to sanitize nipple shield (once/day) by putting in a pot of boiling water for 5-10 minutes.   RN will also make sure Mom has pumped R breast. RN says Mom knows how to use piston hand pump included in DEBP kit.   Richey, Kimberely Hamilton 10/20/2019, 10:15 AM    

## 2020-01-26 ENCOUNTER — Other Ambulatory Visit: Payer: Self-pay

## 2020-01-26 ENCOUNTER — Encounter (HOSPITAL_COMMUNITY): Payer: Self-pay

## 2020-01-26 ENCOUNTER — Ambulatory Visit (HOSPITAL_COMMUNITY)
Admission: EM | Admit: 2020-01-26 | Discharge: 2020-01-26 | Disposition: A | Discharge status: Home / Self Care | Payer: Medicaid Other | Attending: Family Medicine | Admitting: Family Medicine

## 2020-01-26 DIAGNOSIS — Z79899 Other long term (current) drug therapy: Secondary | ICD-10-CM | POA: Diagnosis not present

## 2020-01-26 DIAGNOSIS — Z20822 Contact with and (suspected) exposure to covid-19: Secondary | ICD-10-CM | POA: Diagnosis not present

## 2020-01-26 DIAGNOSIS — J029 Acute pharyngitis, unspecified: Secondary | ICD-10-CM | POA: Insufficient documentation

## 2020-01-26 DIAGNOSIS — Z881 Allergy status to other antibiotic agents status: Secondary | ICD-10-CM | POA: Insufficient documentation

## 2020-01-26 DIAGNOSIS — Z880 Allergy status to penicillin: Secondary | ICD-10-CM | POA: Insufficient documentation

## 2020-01-26 LAB — POCT RAPID STREP A: Streptococcus, Group A Screen (Direct): NEGATIVE

## 2020-01-26 NOTE — Discharge Instructions (Addendum)
Keep taking your allergy medication every day.  Your strep test was negative.  Your Covid swab is pending and we will call you with any positive results.

## 2020-01-26 NOTE — ED Provider Notes (Signed)
[facility]    CSN: [id number] Arrival date & time: 01/26/20  1038      History   Chief Complaint Chief Complaint  Patient presents with  . Sore Throat    HPI Lori Moses is a 24 y.o. female.   Patient is a 24 year old female presents today with approximately 1 day of sore throat.  Symptoms have been constant.  Mild tenderness with swelling.  Thought to be her allergies and took a Zyrtec but denies any specific relief.  Denies any other symptoms to include cough, chest congestion, fever, chills, ear pain. No trouble swallowing.     Past Medical History:  Diagnosis Date  . Acid reflux   . Anemia   . H/O repair of patent ductus arteriosus   . PID (acute pelvic inflammatory disease) 05/2017    Patient Active Problem List   Diagnosis Date Noted  . Postpartum anemia 10/20/2019  . SVD (spontaneous vaginal delivery) 10/20/2019  . Group beta Strep positive 10/18/2019  . Urinary frequency 04/24/2018  . Nausea and vomiting 03/13/2018  . Acid reflux 03/13/2018  . Encounter for planned induction of labor 03/13/2018  . Pelvic pain 11/04/2017  . Vaginal discharge 11/04/2017  . Chlamydia 09/20/2017    Past Surgical History:  Procedure Laterality Date  . PATENT DUCTUS ARTERIOUS REPAIR     as an infant    OB History    Gravida  1   Para  1   Term  1   Preterm  0   AB  0   Living  1     SAB  0   TAB  0   Ectopic  0   Multiple  0   Live Births  1            Home Medications    Prior to Admission medications   Medication Sig Start Date End Date Taking? Authorizing Provider  acetaminophen (TYLENOL) 325 MG tablet Take 2 tablets (650 mg total) by mouth every 4 (four) hours as needed (for pain scale < 4). 10/20/19   [place], [place], FNP  ibuprofen (ADVIL) 600 MG tablet Take 1 tablet (600 mg total) by mouth every 6 (six) hours. 10/20/19   Dale [place], FNP  iron polysaccharides (NIFEREX) 150 MG capsule Take 1 capsule (150 mg total) by  mouth daily. 10/20/19   Dale [place], FNP  Prenatal Vit-Fe Fumarate-FA (PRENATAL COMPLETE) 14-0.4 MG TABS Take 1 tablet by mouth daily. 02/10/19   Alene Mires, NP    Family History Family History  Problem Relation Age of Onset  . Arthritis Mother   . Arthritis Maternal Grandmother   . Breast cancer Paternal Grandmother   . Down syndrome Sister   . Diabetes Maternal Uncle     Social History Social History   Tobacco Use  . Smoking status: Never Smoker  . Smokeless tobacco: Never Used  Substance Use Topics  . Alcohol use: No  . Drug use: No     Allergies   Flagyl [metronidazole], Other, and Penicillins   Review of Systems Review of Systems   Physical Exam Triage Vital Signs ED Triage Vitals  Enc Vitals Group     BP 01/26/20 1125 114/70     Pulse Rate 01/26/20 1125 74     Resp 01/26/20 1125 18     Temp 01/26/20 1125 98.6 F (37 C)     Temp Source 01/26/20 1125 Oral     SpO2 01/26/20 1125 100 %  Weight --      Height --      Head Circumference --      Peak Flow --      Pain Score 01/26/20 1126 6     Pain Loc --      Pain Edu? --      Excl. in [place]? --    No data found.  Updated Vital Signs BP 114/70 (BP Location: Left Arm)   Pulse 74   Temp 98.6 F (37 C) (Oral)   Resp 18   LMP 12/23/2019   SpO2 100%   Visual Acuity Right Eye Distance:   Left Eye Distance:   Bilateral Distance:    Right Eye Near:   Left Eye Near:    Bilateral Near:     Physical Exam Vitals and nursing note reviewed.  Constitutional:      Appearance: She is well-developed.  HENT:     Head: Normocephalic and atraumatic.     Nose: No congestion or rhinorrhea.     Mouth/Throat:     Pharynx: Uvula swelling present.     Tonsils: No tonsillar exudate. 1+ on the right. 1+ on the left.     Comments: pnd  Pulmonary:     Effort: Pulmonary effort is normal.  Musculoskeletal:        General: Normal range of motion.  Lymphadenopathy:     Cervical: No cervical  adenopathy.  Skin:    General: Skin is warm and dry.  Neurological:     Mental Status: She is alert.  Psychiatric:        Mood and Affect: Mood normal.      UC Treatments / Results  Labs (all labs ordered are listed, but only abnormal results are displayed) Labs Reviewed  NOVEL CORONAVIRUS, NAA (HOSP ORDER, SEND-OUT TO REF LAB; TAT 18-24 HRS)  POCT RAPID STREP A    EKG   Radiology No results found.  Procedures Procedures (including critical care time)  Medications Ordered in UC Medications - No data to display  Initial Impression / Assessment and Plan / UC Course  I have reviewed the triage vital signs and the nursing notes.  Pertinent labs & imaging results that were available during my care of the patient were reviewed by me and considered in my medical decision making (see chart for details).     Sore throat-rapid strep test negative here today. With a sore throat may be allergy related due to postnasal drip Or viral  Recommended continue Zyrtec daily Rest, fluids, ibuprofen  Covid swab sent for testing labs pending. Follow up as needed for continued or worsening symptoms  Final Clinical Impressions(s) / UC Diagnoses   Final diagnoses:  Sore throat     Discharge Instructions     Keep taking your allergy medication every day.  Your strep test was negative.  Your Covid swab is pending and we will call you with any positive results.    ED Prescriptions    None     PDMP not reviewed this encounter.   Orvan July, NP 01/26/20 1149

## 2020-01-26 NOTE — ED Triage Notes (Signed)
Pt presents with sore throat since yesterday.  

## 2020-01-27 LAB — NOVEL CORONAVIRUS, NAA (HOSP ORDER, SEND-OUT TO REF LAB; TAT 18-24 HRS): SARS-CoV-2, NAA: NOT DETECTED

## 2020-04-07 DIAGNOSIS — Z124 Encounter for screening for malignant neoplasm of cervix: Secondary | ICD-10-CM | POA: Diagnosis not present

## 2020-04-07 DIAGNOSIS — N915 Oligomenorrhea, unspecified: Secondary | ICD-10-CM | POA: Diagnosis not present

## 2020-05-19 DIAGNOSIS — N914 Secondary oligomenorrhea: Secondary | ICD-10-CM | POA: Diagnosis not present

## 2020-05-19 DIAGNOSIS — E221 Hyperprolactinemia: Secondary | ICD-10-CM | POA: Diagnosis not present

## 2020-06-26 ENCOUNTER — Encounter: Payer: Self-pay | Admitting: Emergency Medicine

## 2020-06-26 ENCOUNTER — Ambulatory Visit
Admission: EM | Admit: 2020-06-26 | Discharge: 2020-06-26 | Disposition: A | Discharge status: Home / Self Care | Payer: Medicaid Other | Attending: Family Medicine | Admitting: Family Medicine

## 2020-06-26 ENCOUNTER — Other Ambulatory Visit: Payer: Self-pay

## 2020-06-26 DIAGNOSIS — N760 Acute vaginitis: Secondary | ICD-10-CM | POA: Diagnosis not present

## 2020-06-26 LAB — POCT URINALYSIS DIP (MANUAL ENTRY)
Bilirubin, UA: NEGATIVE
Blood, UA: NEGATIVE
Glucose, UA: NEGATIVE mg/dL
Leukocytes, UA: NEGATIVE
Nitrite, UA: NEGATIVE
Protein Ur, POC: NEGATIVE mg/dL
Spec Grav, UA: >=1.03 — AB (ref 1.010–1.025)
Urobilinogen, UA: 0.2 E.U./dL
pH, UA: 5.5 (ref 5.0–8.0)

## 2020-06-26 LAB — POCT URINE PREGNANCY: Preg Test, Ur: NEGATIVE

## 2020-06-26 MED ORDER — ONDANSETRON HCL 4 MG PO TABS: 4.0000 mg | ORAL_TABLET | Freq: Four times a day (QID) | ORAL | 0 refills | Status: DC

## 2020-06-26 NOTE — ED Provider Notes (Signed)
[facility]    CSN: [id number] Arrival date & time: 06/26/20  1805      History   Chief Complaint Chief Complaint  Patient presents with   Nausea   Vaginal Discharge    HPI Lori Moses is a 24 y.o. female.   Patient is here complaining of nausea for the past 6 months.  She now has a brownish discharge and lower abdominal pain.  She had a baby in November.  Since then periods of been mostly absent or in one case lasting a month.  She had been seen previously at another facility for chronic nausea.  She has had multiple pregnancy tests all of which are negative.  She is on no birth control.  HPI  Past Medical History:  Diagnosis Date   Acid reflux    Anemia    H/O repair of patent ductus arteriosus    PID (acute pelvic inflammatory disease) 05/2017    Patient Active Problem List   Diagnosis Date Noted   Postpartum anemia 10/20/2019   SVD (spontaneous vaginal delivery) 10/20/2019   Group beta Strep positive 10/18/2019   Urinary frequency 04/24/2018   Nausea and vomiting 03/13/2018   Acid reflux 03/13/2018   Encounter for planned induction of labor 03/13/2018   Pelvic pain 11/04/2017   Vaginal discharge 11/04/2017   Chlamydia 09/20/2017    Past Surgical History:  Procedure Laterality Date   PATENT DUCTUS ARTERIOUS REPAIR     as an infant    OB History    Gravida  1   Para  1   Term  1   Preterm  0   AB  0   Living  1     SAB  0   TAB  0   Ectopic  0   Multiple  0   Live Births  1            Home Medications    Prior to Admission medications   Medication Sig Start Date End Date Taking? Authorizing Provider  acetaminophen (TYLENOL) 325 MG tablet Take 2 tablets (650 mg total) by mouth every 4 (four) hours as needed (for pain scale < 4). 10/20/19   [place], [place], FNP  ibuprofen (ADVIL) 600 MG tablet Take 1 tablet (600 mg total) by mouth every 6 (six) hours. 10/20/19   Dale [place], FNP  iron  polysaccharides (NIFEREX) 150 MG capsule Take 1 capsule (150 mg total) by mouth daily. 10/20/19   [place], Lesly Rubenstein, FNP  ondansetron (ZOFRAN) 4 MG tablet Take 1 tablet (4 mg total) by mouth every 6 (six) hours. 06/26/20   Frederica Kuster, MD  Prenatal Vit-Fe Fumarate-FA (PRENATAL COMPLETE) 14-0.4 MG TABS Take 1 tablet by mouth daily. 02/10/19   Alene Mires, NP    Family History Family History  Problem Relation Age of Onset   Arthritis Mother    Arthritis Maternal Grandmother    Breast cancer Paternal Grandmother    Down syndrome Sister    Diabetes Maternal Uncle     Social History Social History   Tobacco Use   Smoking status: Never Smoker   Smokeless tobacco: Never Used  Building services engineer Use: Never used  Substance Use Topics   Alcohol use: No   Drug use: No     Allergies   Flagyl [metronidazole], Other, and Penicillins   Review of Systems Review of Systems  Gastrointestinal: Positive for nausea.  Genitourinary: Positive for vaginal discharge.  All other systems reviewed  and are negative.    Physical Exam Triage Vital Signs ED Triage Vitals  Enc Vitals Group     BP 06/26/20 1833 125/83     Pulse Rate 06/26/20 1833 80     Resp 06/26/20 1833 16     Temp 06/26/20 1833 98.2 F (36.8 C)     Temp Source 06/26/20 1833 Oral     SpO2 06/26/20 1833 99 %     Weight --      Height --      Head Circumference --      Peak Flow --      Pain Score 06/26/20 1836 0     Pain Loc --      Pain Edu? --      Excl. in [place]? --    No data found.  Updated Vital Signs BP 125/83 (BP Location: Left Arm)    Pulse 80    Temp 98.2 F (36.8 C) (Oral)    Resp 16    LMP 05/18/2020    SpO2 99%   Visual Acuity Right Eye Distance:   Left Eye Distance:   Bilateral Distance:    Right Eye Near:   Left Eye Near:    Bilateral Near:     Physical Exam Vitals and nursing note reviewed.  Constitutional:      Appearance: Normal appearance.  HENT:     Mouth/Throat:      Mouth: Mucous membranes are moist.  Cardiovascular:     Rate and Rhythm: Normal rate and regular rhythm.  Pulmonary:     Effort: Pulmonary effort is normal.     Breath sounds: Normal breath sounds.  Abdominal:     General: Bowel sounds are normal.     Palpations: Abdomen is soft.  Neurological:     Mental Status: She is alert.      UC Treatments / Results  Labs (all labs ordered are listed, but only abnormal results are displayed) Labs Reviewed  POCT URINALYSIS DIP (MANUAL ENTRY) - Abnormal; Notable for the following components:      Result Value   Ketones, POC UA small (15) (*)    Spec Grav, UA >=1.030 (*)    All other components within normal limits  POCT URINE PREGNANCY  CERVICOVAGINAL ANCILLARY ONLY    EKG   Radiology No results found.  Procedures Procedures (including critical care time)  Medications Ordered in UC Medications - No data to display  Initial Impression / Assessment and Plan / UC Course  I have reviewed the triage vital signs and the nursing notes.  Pertinent labs & imaging results that were available during my care of the patient were reviewed by me and considered in my medical decision making (see chart for details).     Nausea.  Pregnancy test negative as is urinalysis.  Swab sent to rule out vaginitis.  Provided Zofran for nausea.  Follow-up with OB/GYN regarding her amenorrhea Final Clinical Impressions(s) / UC Diagnoses   Final diagnoses:  Vaginitis and vulvovaginitis   Discharge Instructions   None    ED Prescriptions    Medication Sig Dispense Auth. Provider   ondansetron (ZOFRAN) 4 MG tablet Take 1 tablet (4 mg total) by mouth every 6 (six) hours. 12 tablet Frederica Kuster, MD     PDMP not reviewed this encounter.   Frederica Kuster, MD 06/26/20 Corky Crafts

## 2020-06-26 NOTE — ED Triage Notes (Signed)
Patient presents to [facility] for assessment of nausea since the end of January, followed up brownish-pinkish discharge since March.  C/o malaise, and lower mid abdominal pain.  Some sharp cramps today.

## 2020-06-30 ENCOUNTER — Telehealth (HOSPITAL_COMMUNITY): Payer: Self-pay

## 2020-06-30 LAB — CERVICOVAGINAL ANCILLARY ONLY
Bacterial Vaginitis (gardnerella): POSITIVE — AB
Candida Glabrata: NEGATIVE
Candida Vaginitis: NEGATIVE
Chlamydia: NEGATIVE
Comment: NEGATIVE
Comment: NEGATIVE
Comment: NEGATIVE
Comment: NEGATIVE
Comment: NEGATIVE
Comment: NORMAL
Neisseria Gonorrhea: NEGATIVE
Trichomonas: NEGATIVE

## 2020-07-05 MED ORDER — CLINDAMYCIN HCL 150 MG PO CAPS: 300.0000 mg | ORAL_CAPSULE | Freq: Two times a day (BID) | ORAL | 0 refills | Status: DC

## 2020-07-05 NOTE — Telephone Encounter (Signed)
Patient positive for BV.  Came to clinic requesting medication.  Allergic to Flagyl.  Discussed with patient, and okay'd to do Clindamycin, per protocol, BID x 7 days.  Prescription sent to pharmacy of request.

## 2020-07-07 DIAGNOSIS — N915 Oligomenorrhea, unspecified: Secondary | ICD-10-CM | POA: Diagnosis not present

## 2020-07-07 DIAGNOSIS — E221 Hyperprolactinemia: Secondary | ICD-10-CM | POA: Diagnosis not present

## 2020-12-06 NOTE — L&D Delivery Note (Signed)
Delivery Note   Patient Name: Lori Moses DOB: [birth year] MRN: [id number]  Date of admission: 08/21/2021 Delivering MD:  Dale [place], CNM Date of delivery: 08/22/21 Type of delivery: SVD  Newborn Data: Live born female  Birth Weight:   APGAR: 9, 9  Newborn Delivery   Birth date/time: 08/22/2021 02:29:00 Delivery type: Vaginal, Spontaneous     [name], 25 y.o., @ [redacted]w[redacted]d,  G2P1001, who was admitted for spontaneous active labor. I was called to the room when she progressed 2+ station in the second stage of labor.  She pushed for 1/min.  She delivered a viable infant, cephalic and restituted to the LOA position over an intact perineum.  A nuchal cord   was not identified. The baby was placed on maternal abdomen while initial step of NRP were perfmored (Dry, Stimulated, and warmed). Hat placed on baby for thermoregulation. Delayed cord clamping was performed for 2 minutes.  Cord double clamped and cut.  Cord cut by pt mother. Apgar scores were 9 and 9. Prophylactic Pitocin was started in the third stage of labor for active management. The placenta delivered spontaneously, shultz, with a 3 vessel cord and was sent to LD.  Inspection revealed 1st degree. An examination of the vaginal vault and cervix was free from lacerations. The uterus was firm, bleeding stable.  The repair was done under lidocaine.   Placenta and umbilical artery blood gas were not sent.  There were no complications during the procedure.  Mom and baby skin to skin following delivery. Left in stable condition.  Maternal Info: Anesthesia: Lidocaine with repair Episiotomy: no Lacerations:  1st degree Suture Repair: 3.0 SH vicryl Est. Blood Loss (mL):   Newborn Info:  Baby Sex: female APGAR (1 MIN): 9   APGAR (5 MINS): 9   APGAR (10 MINS):     Mom to postpartum.  Baby to Couplet care / Skin to Skin.   [place], [place], NP-C 08/22/21 2:56 AM

## 2021-01-01 ENCOUNTER — Inpatient Hospital Stay (HOSPITAL_COMMUNITY): Payer: Medicaid Other

## 2021-01-01 ENCOUNTER — Inpatient Hospital Stay (HOSPITAL_COMMUNITY)
Admission: AD | Admit: 2021-01-01 | Discharge: 2021-01-01 | Disposition: A | Discharge status: Home / Self Care | Payer: Medicaid Other | Attending: Obstetrics and Gynecology | Admitting: Obstetrics and Gynecology

## 2021-01-01 ENCOUNTER — Encounter (HOSPITAL_COMMUNITY): Payer: Self-pay | Admitting: Obstetrics and Gynecology

## 2021-01-01 ENCOUNTER — Other Ambulatory Visit: Payer: Self-pay

## 2021-01-01 DIAGNOSIS — O468X1 Other antepartum hemorrhage, first trimester: Secondary | ICD-10-CM | POA: Diagnosis not present

## 2021-01-01 DIAGNOSIS — O208 Other hemorrhage in early pregnancy: Secondary | ICD-10-CM | POA: Diagnosis not present

## 2021-01-01 DIAGNOSIS — Z3A01 Less than 8 weeks gestation of pregnancy: Secondary | ICD-10-CM | POA: Insufficient documentation

## 2021-01-01 DIAGNOSIS — R109 Unspecified abdominal pain: Secondary | ICD-10-CM

## 2021-01-01 DIAGNOSIS — O26891 Other specified pregnancy related conditions, first trimester: Secondary | ICD-10-CM | POA: Diagnosis not present

## 2021-01-01 DIAGNOSIS — Z3490 Encounter for supervision of normal pregnancy, unspecified, unspecified trimester: Secondary | ICD-10-CM

## 2021-01-01 DIAGNOSIS — O26899 Other specified pregnancy related conditions, unspecified trimester: Secondary | ICD-10-CM

## 2021-01-01 LAB — URINALYSIS, ROUTINE W REFLEX MICROSCOPIC
Bilirubin Urine: NEGATIVE
Glucose, UA: NEGATIVE mg/dL
Hgb urine dipstick: NEGATIVE
Ketones, ur: NEGATIVE mg/dL
Nitrite: NEGATIVE
Protein, ur: NEGATIVE mg/dL
Specific Gravity, Urine: 1.015 (ref 1.005–1.030)
pH: 8 (ref 5.0–8.0)

## 2021-01-01 LAB — COMPREHENSIVE METABOLIC PANEL
ALT: 10 U/L (ref 0–44)
AST: 14 U/L — ABNORMAL LOW (ref 15–41)
Albumin: 3.6 g/dL (ref 3.5–5.0)
Alkaline Phosphatase: 52 U/L (ref 38–126)
Anion gap: 7 (ref 5–15)
BUN: 6 mg/dL (ref 6–20)
CO2: 24 mmol/L (ref 22–32)
Calcium: 9.1 mg/dL (ref 8.9–10.3)
Chloride: 105 mmol/L (ref 98–111)
Creatinine, Ser: 0.62 mg/dL (ref 0.44–1.00)
GFR, Estimated: >60 mL/min (ref >60)
Glucose, Bld: 86 mg/dL (ref 70–99)
Potassium: 4.3 mmol/L (ref 3.5–5.1)
Sodium: 136 mmol/L (ref 135–145)
Total Bilirubin: 0.7 mg/dL (ref 0.3–1.2)
Total Protein: 6.7 g/dL (ref 6.5–8.1)

## 2021-01-01 LAB — CBC
HCT: 35.9 % — ABNORMAL LOW (ref 36.0–46.0)
Hemoglobin: 11.7 g/dL — ABNORMAL LOW (ref 12.0–15.0)
MCH: 27.4 pg (ref 26.0–34.0)
MCHC: 32.6 g/dL (ref 30.0–36.0)
MCV: 84.1 fL (ref 80.0–100.0)
Platelets: 164 10*3/uL (ref 150–400)
RBC: 4.27 MIL/uL (ref 3.87–5.11)
RDW: 14.2 % (ref 11.5–15.5)
WBC: 5.9 10*3/uL (ref 4.0–10.5)
nRBC: 0 % (ref 0.0–0.2)

## 2021-01-01 LAB — POCT PREGNANCY, URINE: Preg Test, Ur: POSITIVE — AB

## 2021-01-01 LAB — HCG, QUANTITATIVE, PREGNANCY: hCG, Beta Chain, Quant, S: 53563 m[IU]/mL — ABNORMAL HIGH (ref <5)

## 2021-01-01 MED ORDER — VITAFOL GUMMIES 3.33-0.333-34.8 MG PO CHEW: 1.0000 | CHEWABLE_TABLET | Freq: Every day | ORAL | 5 refills | Status: DC

## 2021-01-01 MED ORDER — ACETAMINOPHEN 325 MG PO TABS: 650.0000 mg | ORAL_TABLET | ORAL | 0 refills | Status: AC | PRN

## 2021-01-01 NOTE — Discharge Instructions (Signed)
Abdominal Pain During Pregnancy Belly (abdominal) pain is common during pregnancy. There are many possible causes. Some causes are more serious than others. Sometimes the cause is not known. Always tell your doctor if you have belly pain. Follow these instructions at home:  Do not have sex or put anything in your vagina until your pain goes away completely.  Get plenty of rest until your pain gets better.  Drink enough fluid to keep your pee (urine) pale yellow.  Take over-the-counter and prescription medicines only as told by your doctor.  Keep all follow-up visits.   Contact a doctor if:  You keep having pain after resting.  Your pain gets worse after resting.  You have lower belly pain that: ? Comes and goes at regular times. ? Spreads to your back. ? Feels like menstrual cramps.  You have pain or burning when you pee (urinate). Get help right away if:  You have a fever or chills.  You feel like it is hard to breathe.  You have bleeding from your vagina.  You are leaking fluid or tissue from your vagina.  You vomit for more than 24 hours.  You have watery poop (diarrhea) for more than 24 hours.  Your baby is moving less than usual.  You feel very weak or faint.  You have very bad pain in your upper belly. Summary  Belly pain is common during pregnancy. There are many possible causes.  If you have belly pain during pregnancy, tell your doctor right away.  Keep all follow-up visits. This information is not intended to replace advice given to you by your health care provider. Make sure you discuss any questions you have with your health care provider. Document Revised: 08/05/2020 Document Reviewed: 08/05/2020 Elsevier Patient Education  2021 Elsevier Inc.  

## 2021-01-01 NOTE — MAU Note (Signed)
Pt reports she has had sharp abs pain on and off for the past 2 weeks. Denies any vag bleeding or discharge but reports a strong vag odor.Marland Kitchen

## 2021-01-01 NOTE — MAU Provider Note (Signed)
History     CSN: [id number]  Arrival date and time: 01/01/21 0945   Event Date/Time   First Provider Initiated Contact with Patient 01/01/21 1153      Chief Complaint  Patient presents with  . Abdominal Pain   HPI SOL ODOR is a 25 y.o. G2P1001 in early pregnancy who presents to MAU with chief complaint of suprapubic cramping. This is a new problem, onset two weeks ago. She denies pain score 4/10 in triage in MAU. Her pain resolves without intervention. She has not taken medication or tried other treatments for this complaint.  Patient's dating is uncertain. She endorses LMP possibly in mid-December. She reports one episode of unprotected sex on 12/27 after which she took Plan B on 12/28    OB History    Gravida  2   Para  1   Term  1   Preterm  0   AB  0   Living  1     SAB  0   IAB  0   Ectopic  0   Multiple  0   Live Births  1           Past Medical History:  Diagnosis Date  . Acid reflux   . Anemia   . H/O repair of patent ductus arteriosus   . PID (acute pelvic inflammatory disease) 05/2017    Past Surgical History:  Procedure Laterality Date  . PATENT DUCTUS ARTERIOUS REPAIR     as an infant    Family History  Problem Relation Age of Onset  . Arthritis Mother   . Arthritis Maternal Grandmother   . Breast cancer Paternal Grandmother   . Down syndrome Sister   . Diabetes Maternal Uncle     Social History   Tobacco Use  . Smoking status: Never Smoker  . Smokeless tobacco: Never Used  Vaping Use  . Vaping Use: Never used  Substance Use Topics  . Alcohol use: No  . Drug use: No    Allergies:  Allergies  Allergen Reactions  . Flagyl [Metronidazole] Hives  . Other Hives    potatoes  . Penicillins Hives    Has patient had a PCN reaction causing immediate rash, facial/tongue/throat swelling, SOB or lightheadedness with hypotension: Yes Has patient had a PCN reaction causing severe rash involving mucus membranes or skin  necrosis: No Has patient had a PCN reaction that required hospitalization No Has patient had a PCN reaction occurring within the last 10 years: No If all of the above answers are "NO", then may proceed with Cephalosporin use.     Medications Prior to Admission  Medication Sig Dispense Refill Last Dose  . acetaminophen (TYLENOL) 325 MG tablet Take 2 tablets (650 mg total) by mouth every 4 (four) hours as needed (for pain scale < 4). 30 tablet 0   . clindamycin (CLEOCIN) 150 MG capsule Take 2 capsules (300 mg total) by mouth in the morning and at bedtime. 14 capsule 0   . ibuprofen (ADVIL) 600 MG tablet Take 1 tablet (600 mg total) by mouth every 6 (six) hours. 30 tablet 0   . iron polysaccharides (NIFEREX) 150 MG capsule Take 1 capsule (150 mg total) by mouth daily. 30 capsule 1   . ondansetron (ZOFRAN) 4 MG tablet Take 1 tablet (4 mg total) by mouth every 6 (six) hours. 12 tablet 0   . Prenatal Vit-Fe Fumarate-FA (PRENATAL COMPLETE) 14-0.4 MG TABS Take 1 tablet by mouth daily. 60 each 3  Review of Systems  Gastrointestinal: Positive for abdominal pain.  Genitourinary: Negative for dysuria and vaginal bleeding.  Musculoskeletal: Negative for back pain.  All other systems reviewed and are negative.  Physical Exam   Blood pressure 116/65, pulse 79, temperature 98.8 F (37.1 C), resp. rate 18, last menstrual period 11/17/2020, unknown if currently breastfeeding.  Physical Exam Vitals and nursing note reviewed. Exam conducted with a chaperone present.  Constitutional:      Appearance: She is well-developed.  Cardiovascular:     Rate and Rhythm: Normal rate.     Heart sounds: Normal heart sounds.  Pulmonary:     Effort: Pulmonary effort is normal.     Breath sounds: Normal breath sounds.  Abdominal:     General: Abdomen is flat.     Palpations: Abdomen is soft.     Tenderness: There is no abdominal tenderness.  Skin:    Capillary Refill: Capillary refill takes less than 2  seconds.  Neurological:     Mental Status: She is alert and oriented to person, place, and time.     MAU Course  Procedures  Orders Placed This Encounter  Procedures  . Wet prep, genital  . US OB LESS THAN 14 WEEKS WITH OB TRANSVAGINAL  . Urinalysis, Routine w reflex microscopic Urine, Clean Catch  . CBC  . Comprehensive metabolic panel  . hCG, quantitative, pregnancy  . Nursing communication  . Pregnancy, urine POC  . Discharge patient   Patient Vitals for the past 24 hrs:  BP Temp Pulse Resp  01/01/21 1150 116/65 98.8 F (37.1 C) 79 18   Results for orders placed or performed during the hospital encounter of 01/01/21 (from the past 24 hour(s))  Urinalysis, Routine w reflex microscopic Urine, Clean Catch     Status: Abnormal   Collection Time: 01/01/21 11:15 AM  Result Value Ref Range   Color, Urine YELLOW YELLOW   APPearance CLEAR CLEAR   Specific Gravity, Urine 1.015 1.005 - 1.030   pH 8.0 5.0 - 8.0   Glucose, UA NEGATIVE NEGATIVE mg/dL   Hgb urine dipstick NEGATIVE NEGATIVE   Bilirubin Urine NEGATIVE NEGATIVE   Ketones, ur NEGATIVE NEGATIVE mg/dL   Protein, ur NEGATIVE NEGATIVE mg/dL   Nitrite NEGATIVE NEGATIVE   Leukocytes,Ua TRACE (A) NEGATIVE   RBC / HPF 0-5 0 - 5 RBC/hpf   WBC, UA 0-5 0 - 5 WBC/hpf   Bacteria, UA RARE (A) NONE SEEN   Squamous Epithelial / LPF 0-5 0 - 5   Mucus PRESENT   Pregnancy, urine POC     Status: Abnormal   Collection Time: 01/01/21 11:17 AM  Result Value Ref Range   Preg Test, Ur POSITIVE (A) NEGATIVE  CBC     Status: Abnormal   Collection Time: 01/01/21 12:16 PM  Result Value Ref Range   WBC 5.9 4.0 - 10.5 K/uL   RBC 4.27 3.87 - 5.11 MIL/uL   Hemoglobin 11.7 (L) 12.0 - 15.0 g/dL   HCT 61.4 (L) 43.1 - 54.0 %   MCV 84.1 80.0 - 100.0 fL   MCH 27.4 26.0 - 34.0 pg   MCHC 32.6 30.0 - 36.0 g/dL   RDW 08.6 76.1 - 95.0 %   Platelets 164 150 - 400 K/uL   nRBC 0.0 0.0 - 0.2 %  Comprehensive metabolic panel     Status: Abnormal    Collection Time: 01/01/21 12:16 PM  Result Value Ref Range   Sodium 136 135 - 145 mmol/L   Potassium  4.3 3.5 - 5.1 mmol/L   Chloride 105 98 - 111 mmol/L   CO2 24 22 - 32 mmol/L   Glucose, Bld 86 70 - 99 mg/dL   BUN 6 6 - 20 mg/dL   Creatinine, Ser 5.40 0.44 - 1.00 mg/dL   Calcium 9.1 8.9 - 08.6 mg/dL   Total Protein 6.7 6.5 - 8.1 g/dL   Albumin 3.6 3.5 - 5.0 g/dL   AST 14 (L) 15 - 41 U/L   ALT 10 0 - 44 U/L   Alkaline Phosphatase 52 38 - 126 U/L   Total Bilirubin 0.7 0.3 - 1.2 mg/dL   GFR, Estimated >76 >19 mL/min   Anion gap 7 5 - 15  hCG, quantitative, pregnancy     Status: Abnormal   Collection Time: 01/01/21 12:16 PM  Result Value Ref Range   hCG, Beta Chain, Quant, S 53,563 (H) <5 mIU/mL   US OB LESS THAN 14 WEEKS WITH OB TRANSVAGINAL  Result Date: 01/01/2021 CLINICAL DATA:  Abdominal cramping in first trimester pregnancy, LMP 11/17/2020 EXAM: OBSTETRIC <14 WK [place] AND TRANSVAGINAL OB US TECHNIQUE: Both transabdominal and transvaginal ultrasound examinations were performed for complete evaluation of the gestation as well as the maternal uterus, adnexal regions, and pelvic cul-de-sac. Transvaginal technique was performed to assess early pregnancy. COMPARISON:  None for this gestation FINDINGS: Intrauterine gestational sac: Present, single Yolk sac:  Present Embryo:  Present Cardiac Activity: Present Heart Rate: 114 bpm CRL:  5.5 mm   6 w   1 d                  [place] EDC: 08/26/2021 Subchorionic hemorrhage:  Small subchronic hemorrhage Maternal uterus/adnexae: Uterus anteverted, otherwise unremarkable. RIGHT ovary normal size and morphology 4.6 x 2.4 x 2.1 cm. LEFT ovary normal size and morphology 3.3 x 2.3 x 1.8 cm. No free pelvic fluid or adnexal masses. IMPRESSION: Single live intrauterine gestation at 6 weeks 1 day EGA by crown-rump length. No acute abnormalities. Electronically Signed   By: Ulyses Southward M.D.   On: 01/01/2021 13:32   Meds ordered this encounter  Medications  . Prenatal  Vit-Fe Phos-FA-Omega (VITAFOL GUMMIES) 3.33-0.333-34.8 MG CHEW    Sig: Chew 1 tablet by mouth daily.    Dispense:  90 tablet    Refill:  5    Order Specific Question:   Supervising Provider    Answer:   Venora Maples [[id number]]  . acetaminophen (TYLENOL) 325 MG tablet    Sig: Take 2 tablets (650 mg total) by mouth every 4 (four) hours as needed.    Dispense:  180 tablet    Refill:  0    Order Specific Question:   Supervising Provider    Answer:   Venora Maples [[id number]]   Assessment and Plan  --25 y.o. G2P1001 with confirmed IUP  --Subchorionic hemorrhage, pelvic rest advised --Blood type O POS --Discharge home in stable condition  F/U: --Patient to select [facility] Provider  Calvert Cantor, CNM 01/01/2021, 5:05 PM

## 2021-01-31 ENCOUNTER — Other Ambulatory Visit: Payer: Self-pay

## 2021-01-31 ENCOUNTER — Inpatient Hospital Stay (HOSPITAL_COMMUNITY)
Admission: AD | Admit: 2021-01-31 | Discharge: 2021-01-31 | Disposition: A | Discharge status: Home / Self Care | Payer: Medicaid Other | Attending: Obstetrics & Gynecology | Admitting: Obstetrics & Gynecology

## 2021-01-31 ENCOUNTER — Encounter (HOSPITAL_COMMUNITY): Payer: Self-pay | Admitting: Obstetrics & Gynecology

## 2021-01-31 DIAGNOSIS — K5900 Constipation, unspecified: Secondary | ICD-10-CM | POA: Diagnosis not present

## 2021-01-31 DIAGNOSIS — O99611 Diseases of the digestive system complicating pregnancy, first trimester: Secondary | ICD-10-CM

## 2021-01-31 DIAGNOSIS — B9689 Other specified bacterial agents as the cause of diseases classified elsewhere: Secondary | ICD-10-CM

## 2021-01-31 DIAGNOSIS — Z3A10 10 weeks gestation of pregnancy: Secondary | ICD-10-CM

## 2021-01-31 DIAGNOSIS — O23591 Infection of other part of genital tract in pregnancy, first trimester: Secondary | ICD-10-CM

## 2021-01-31 DIAGNOSIS — Z880 Allergy status to penicillin: Secondary | ICD-10-CM | POA: Insufficient documentation

## 2021-01-31 DIAGNOSIS — N898 Other specified noninflammatory disorders of vagina: Secondary | ICD-10-CM | POA: Diagnosis present

## 2021-01-31 DIAGNOSIS — N760 Acute vaginitis: Secondary | ICD-10-CM

## 2021-01-31 DIAGNOSIS — M549 Dorsalgia, unspecified: Secondary | ICD-10-CM | POA: Diagnosis not present

## 2021-01-31 LAB — URINALYSIS, ROUTINE W REFLEX MICROSCOPIC
Bilirubin Urine: NEGATIVE
Glucose, UA: NEGATIVE mg/dL
Hgb urine dipstick: NEGATIVE
Ketones, ur: NEGATIVE mg/dL
Leukocytes,Ua: NEGATIVE
Nitrite: NEGATIVE
Protein, ur: NEGATIVE mg/dL
Specific Gravity, Urine: 1.017 (ref 1.005–1.030)
pH: 6 (ref 5.0–8.0)

## 2021-01-31 LAB — WET PREP, GENITAL
Clue Cells Wet Prep HPF POC: NONE SEEN
Sperm: NONE SEEN
Trich, Wet Prep: NONE SEEN
Yeast Wet Prep HPF POC: NONE SEEN

## 2021-01-31 MED ORDER — CLINDAMYCIN PHOSPHATE 1 % EX GEL: CUTANEOUS | 0 refills | Status: AC

## 2021-01-31 MED ORDER — DOCUSATE SODIUM 250 MG PO CAPS: 250.0000 mg | ORAL_CAPSULE | Freq: Every day | ORAL | 1 refills | Status: DC

## 2021-01-31 NOTE — MAU Note (Signed)
Presents with c/o spotting that began last  Thursday, but none today.  Last spotted yesterday.  Also c/o vaginal odor that began 1 month ago, states having whote clumpy discharge.  Pt also c/o lower back pain that's began 1 week ago.

## 2021-01-31 NOTE — Discharge Instructions (Signed)
Constipation, Adult Constipation is when a person has fewer than three bowel movements in a week, has difficulty having a bowel movement, or has stools (feces) that are dry, hard, or larger than normal. Constipation may be caused by an underlying condition. It may become worse with age if a person takes certain medicines and does not take in enough fluids. Follow these instructions at home: Eating and drinking  Eat foods that have a lot of fiber, such as beans, whole grains, and fresh fruits and vegetables.  Limit foods that are low in fiber and high in fat and processed sugars, such as fried or sweet foods. These include french fries, hamburgers, cookies, candies, and soda.  Drink enough fluid to keep your urine pale yellow.   General instructions  Exercise regularly or as told by your health care provider. Try to do 150 minutes of moderate exercise each week.  Use the bathroom when you have the urge to go. Do not hold it in.  Take over-the-counter and prescription medicines only as told by your health care provider. This includes any fiber supplements.  During bowel movements: ? Practice deep breathing while relaxing the lower abdomen. ? Practice pelvic floor relaxation.  Watch your condition for any changes. Let your health care provider know about them.  Keep all follow-up visits as told by your health care provider. This is important. Contact a health care provider if:  You have pain that gets worse.  You have a fever.  You do not have a bowel movement after 4 days.  You vomit.  You are not hungry or you lose weight.  You are bleeding from the opening between the buttocks (anus).  You have thin, pencil-like stools. Get help right away if:  You have a fever and your symptoms suddenly get worse.  You leak stool or have blood in your stool.  Your abdomen is bloated.  You have severe pain in your abdomen.  You feel dizzy or you faint. Summary  Constipation is  when a person has fewer than three bowel movements in a week, has difficulty having a bowel movement, or has stools (feces) that are dry, hard, or larger than normal.  Eat foods that have a lot of fiber, such as beans, whole grains, and fresh fruits and vegetables.  Drink enough fluid to keep your urine pale yellow.  Take over-the-counter and prescription medicines only as told by your health care provider. This includes any fiber supplements. This information is not intended to replace advice given to you by your health care provider. Make sure you discuss any questions you have with your health care provider. Document Revised: 10/10/2019 Document Reviewed: 10/10/2019 Elsevier Patient Education  2021 Elsevier Inc. Safe Medications in Pregnancy   Acne: Benzoyl Peroxide Salicylic Acid  Backache/Headache: Tylenol: 2 regular strength every 4 hours OR              2 Extra strength every 6 hours  Colds/Coughs/Allergies: Benadryl (alcohol free) 25 mg every 6 hours as needed Breath right strips Claritin Cepacol throat lozenges Chloraseptic throat spray Cold-Eeze- up to three times per day Cough drops, alcohol free Flonase (by prescription only) Guaifenesin Mucinex Robitussin DM (plain only, alcohol free) Saline nasal spray/drops Sudafed (pseudoephedrine) & Actifed ** use only after [redacted] weeks gestation and if you do not have high blood pressure Tylenol Vicks Vaporub Zinc lozenges Zyrtec   Constipation: Colace Ducolax suppositories Fleet enema Glycerin suppositories Metamucil Milk of magnesia Miralax Senokot Smooth move tea  Diarrhea:  Kaopectate Imodium A-D  *NO pepto Bismol  Hemorrhoids: Anusol Anusol HC Preparation H Tucks  Indigestion: Tums Maalox Mylanta Zantac  Pepcid  Insomnia: Benadryl (alcohol free) 25mg  every 6 hours as needed Tylenol PM Unisom, no Gelcaps  Leg Cramps: Tums MagGel  Nausea/Vomiting:  Bonine Dramamine Emetrol Ginger  extract Sea bands Meclizine  Nausea medication to take during pregnancy:  Unisom (doxylamine succinate 25 mg tablets) Take one tablet daily at bedtime. If symptoms are not adequately controlled, the dose can be increased to a maximum recommended dose of two tablets daily (1/2 tablet in the morning, 1/2 tablet mid-afternoon and one at bedtime). Vitamin B6 100mg  tablets. Take one tablet twice a day (up to 200 mg per day).  Skin Rashes: Aveeno products Benadryl cream or 25mg  every 6 hours as needed Calamine Lotion 1% cortisone cream  Yeast infection: Gyne-lotrimin 7 Monistat 7   **If taking multiple medications, please check labels to avoid duplicating the same active ingredients **take medication as directed on the label ** Do not exceed 4000 mg of tylenol in 24 hours **Do not take medications that contain aspirin or ibuprofen

## 2021-01-31 NOTE — MAU Note (Signed)
Patient here due to "odor, spotting and back pain"

## 2021-01-31 NOTE — MAU Provider Note (Signed)
History     CSN: [id number]  Arrival date and time: 01/31/21 1801   Event Date/Time   First Provider Initiated Contact with Patient 01/31/21 1840      Chief Complaint  Patient presents with  . Spotting  . Back Pain  . Vaginal Discharge  . Vaginal Odor   HPI Lori Moses is a 25 y.o. G2P1001 at [redacted]w[redacted]d who presents with vaginal discharge, odor and back pain. She states this has been ongoing the entire week and she also had two episodes of spotting, none now. She reports intercourse on Wednesday and spotting after. She also reports issues with constipation. She thinks the discharge and odor is related to BV.   OB History    Gravida  2   Para  1   Term  1   Preterm  0   AB  0   Living  1     SAB  0   IAB  0   Ectopic  0   Multiple  0   Live Births  1           Past Medical History:  Diagnosis Date  . Acid reflux   . Anemia   . H/O repair of patent ductus arteriosus   . PID (acute pelvic inflammatory disease) 05/2017    Past Surgical History:  Procedure Laterality Date  . PATENT DUCTUS ARTERIOUS REPAIR     as an infant    Family History  Problem Relation Age of Onset  . Arthritis Mother   . Arthritis Maternal Grandmother   . Breast cancer Paternal Grandmother   . Down syndrome Sister   . Diabetes Maternal Uncle     Social History   Tobacco Use  . Smoking status: Never Smoker  . Smokeless tobacco: Never Used  Vaping Use  . Vaping Use: Never used  Substance Use Topics  . Alcohol use: No  . Drug use: No    Allergies:  Allergies  Allergen Reactions  . Flagyl [Metronidazole] Hives  . Other Hives    potatoes  . Penicillins Hives    Has patient had a PCN reaction causing immediate rash, facial/tongue/throat swelling, SOB or lightheadedness with hypotension: Yes Has patient had a PCN reaction causing severe rash involving mucus membranes or skin necrosis: No Has patient had a PCN reaction that required hospitalization No Has patient  had a PCN reaction occurring within the last 10 years: No If all of the above answers are "NO", then may proceed with Cephalosporin use.     Medications Prior to Admission  Medication Sig Dispense Refill Last Dose  . Prenatal Vit-Fe Phos-FA-Omega (VITAFOL GUMMIES) 3.33-0.333-34.8 MG CHEW Chew 1 tablet by mouth daily. 90 tablet 5 01/31/2021 at Unknown time  . acetaminophen (TYLENOL) 325 MG tablet Take 2 tablets (650 mg total) by mouth every 4 (four) hours as needed. 180 tablet 0 More than a month at Unknown time    Review of Systems  Constitutional: Negative.  Negative for fatigue and fever.  HENT: Negative.   Respiratory: Negative.  Negative for shortness of breath.   Cardiovascular: Negative.  Negative for chest pain.  Gastrointestinal: Negative.  Negative for abdominal pain, constipation, diarrhea, nausea and vomiting.  Genitourinary: Positive for vaginal bleeding and vaginal discharge. Negative for dysuria.  Musculoskeletal: Positive for back pain.  Neurological: Negative.  Negative for dizziness and headaches.   Physical Exam   Blood pressure 111/65, pulse 79, temperature 98.5 F (36.9 C), temperature source Oral, resp. rate 20,  height 5\' 6"  (1.676 m), weight 70.9 kg, last menstrual period 11/17/2020, SpO2 100 %, not currently breastfeeding.  Physical Exam Vitals and nursing note reviewed.  Constitutional:      General: She is not in acute distress.    Appearance: She is well-developed and well-nourished.  HENT:     Head: Normocephalic.  Eyes:     Pupils: Pupils are equal, round, and reactive to light.  Cardiovascular:     Rate and Rhythm: Normal rate and regular rhythm.     Heart sounds: Normal heart sounds.  Pulmonary:     Effort: Pulmonary effort is normal. No respiratory distress.     Breath sounds: Normal breath sounds.  Abdominal:     General: Bowel sounds are normal. There is no distension.     Palpations: Abdomen is soft.     Tenderness: There is no abdominal  tenderness.  Genitourinary:    Comments: No blood on exam, palpable stool in rectum Skin:    General: Skin is warm and dry.  Neurological:     Mental Status: She is alert and oriented to person, place, and time.  Psychiatric:        Mood and Affect: Mood and affect normal.        Behavior: Behavior normal.        Thought Content: Thought content normal.        Judgment: Judgment normal.    Cervix: closed/thick/posterior  FHT: 170 bpm MAU Course  Procedures Results for orders placed or performed during the hospital encounter of 01/31/21 (from the past 24 hour(s))  Urinalysis, Routine w reflex microscopic Urine, Clean Catch     Status: None   Collection Time: 01/31/21  6:45 PM  Result Value Ref Range   Color, Urine YELLOW YELLOW   APPearance CLEAR CLEAR   Specific Gravity, Urine 1.017 1.005 - 1.030   pH 6.0 5.0 - 8.0   Glucose, UA NEGATIVE NEGATIVE mg/dL   Hgb urine dipstick NEGATIVE NEGATIVE   Bilirubin Urine NEGATIVE NEGATIVE   Ketones, ur NEGATIVE NEGATIVE mg/dL   Protein, ur NEGATIVE NEGATIVE mg/dL   Nitrite NEGATIVE NEGATIVE   Leukocytes,Ua NEGATIVE NEGATIVE  Wet prep, genital     Status: Abnormal   Collection Time: 01/31/21  6:45 PM  Result Value Ref Range   Yeast Wet Prep HPF POC NONE SEEN NONE SEEN   Trich, Wet Prep NONE SEEN NONE SEEN   Clue Cells Wet Prep HPF POC NONE SEEN NONE SEEN   WBC, Wet Prep HPF POC MANY (A) NONE SEEN   Sperm NONE SEEN      MDM UA Wet prep and gc/chlamydia  Will treat for BV due to patient reports of symptoms consistent with previous infections.  Assessment and Plan   1. Bacterial vaginosis   2. Constipation during pregnancy in first trimester   3. [redacted] weeks gestation of pregnancy    -Discharge home in stable condition -Rx for clindagel and colace sent to patient's pharmacy -First trimester precautions discussed -Patient advised to follow-up with OB as scheduled for prenatal care -Patient may return to MAU as needed or if her  condition were to change or worsen   02/02/21 CNM 01/31/2021, 7:16 PM

## 2021-02-02 LAB — GC/CHLAMYDIA PROBE AMP (~~LOC~~) NOT AT ARMC
Chlamydia: NEGATIVE
Comment: NEGATIVE
Comment: NORMAL
Neisseria Gonorrhea: NEGATIVE

## 2021-02-10 ENCOUNTER — Encounter (HOSPITAL_COMMUNITY): Payer: Self-pay | Admitting: Obstetrics & Gynecology

## 2021-02-21 IMAGING — US OBSTETRIC <14 WK US AND TRANSVAGINAL OB US
1 series · 15 of 28 positions shown · non-contrast
Comparison: None.

CLINICAL DATA: Vaginal bleeding in early pregnancy.

EXAM:
OBSTETRIC <14 WK US AND TRANSVAGINAL OB US
TECHNIQUE: Both transabdominal and transvaginal ultrasound examinations were
performed for complete evaluation of the gestation as well as the
maternal uterus, adnexal regions, and pelvic cul-de-sac.
Transvaginal technique was performed to assess early pregnancy.

[Series 1: obstetric <14 wk us and transvaginal ob us · 15 of 62 slices shown]
[im 1/62]
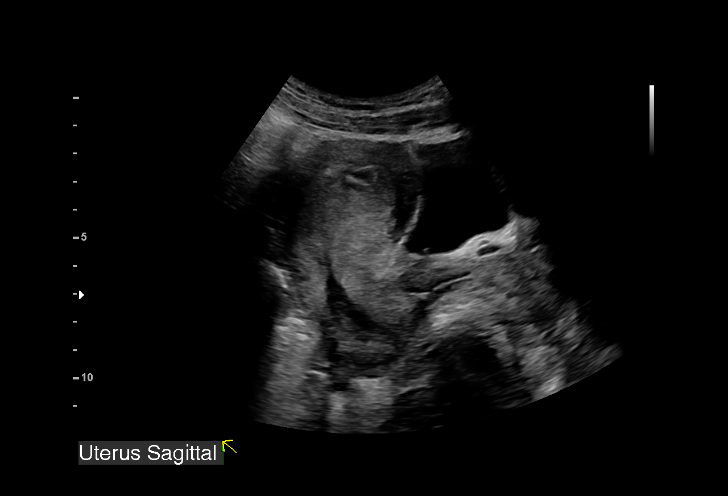
[im 5/62]
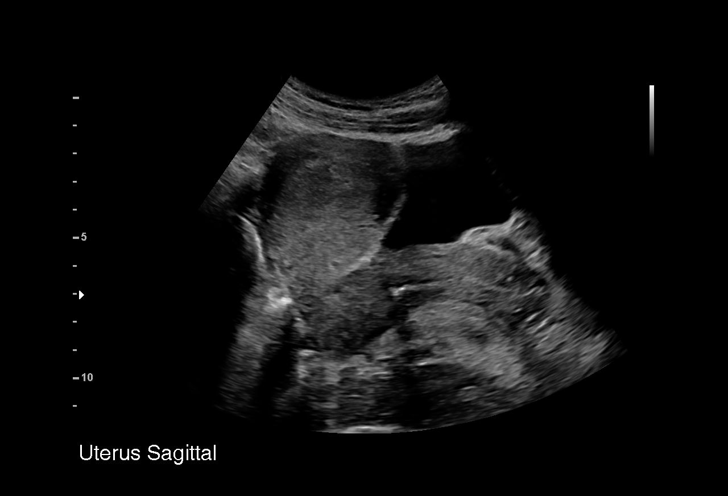
[im 10/62]
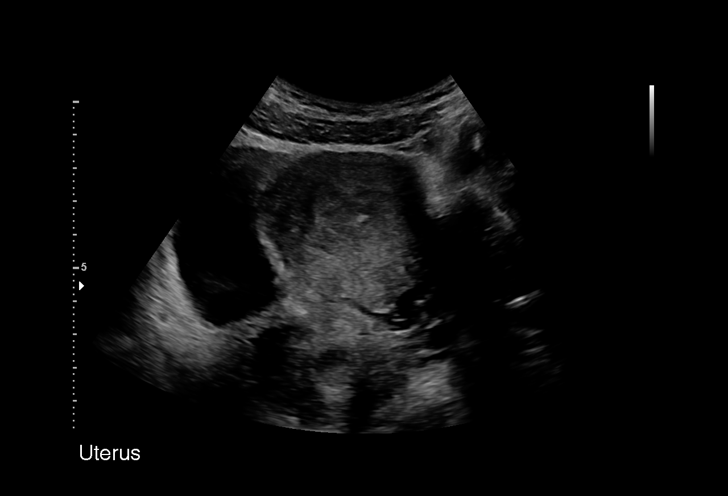
[im 14/62]
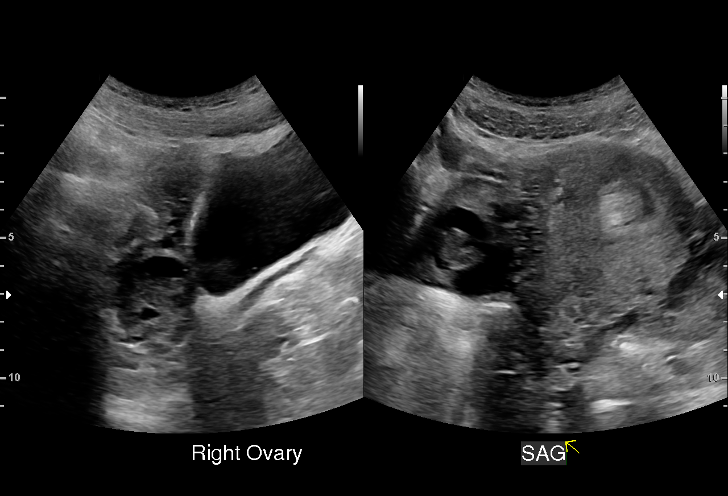
[im 19/62]
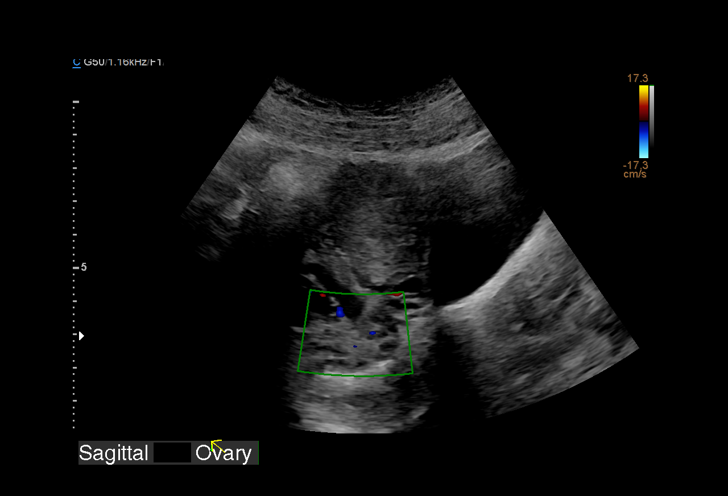
[im 23/62]
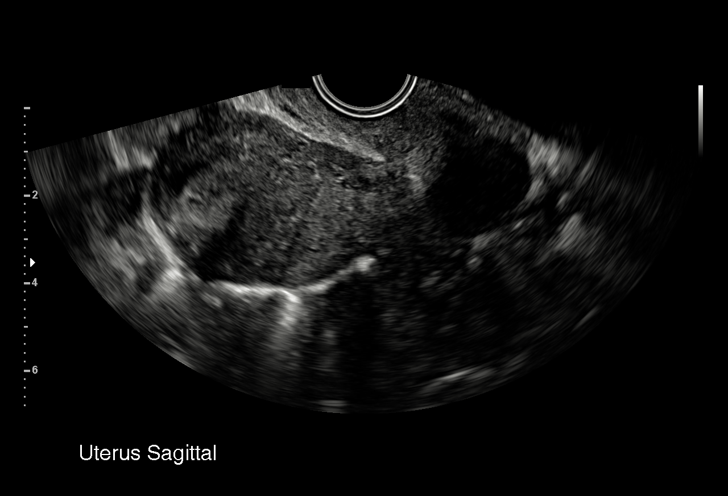
[im 28/62]
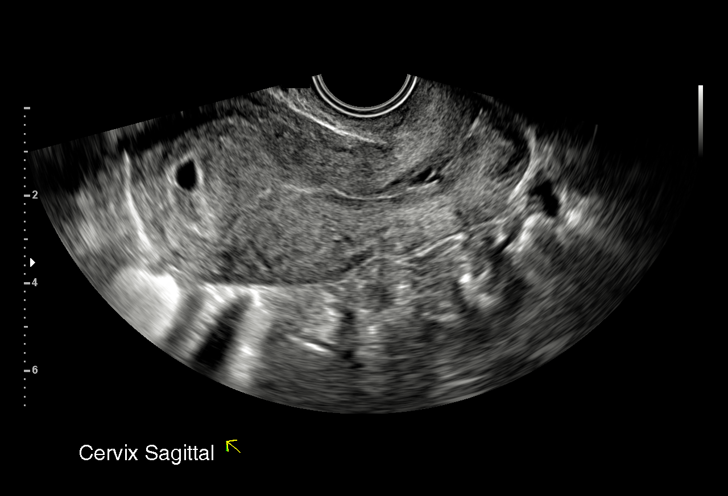
[im 32/62]
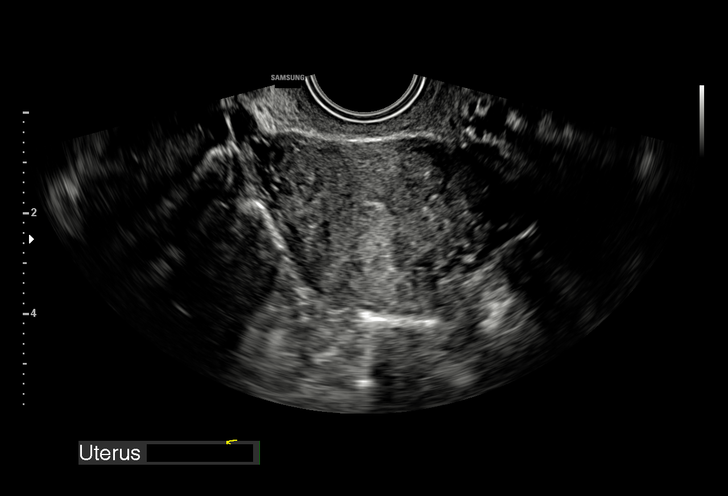
[im 34/62]
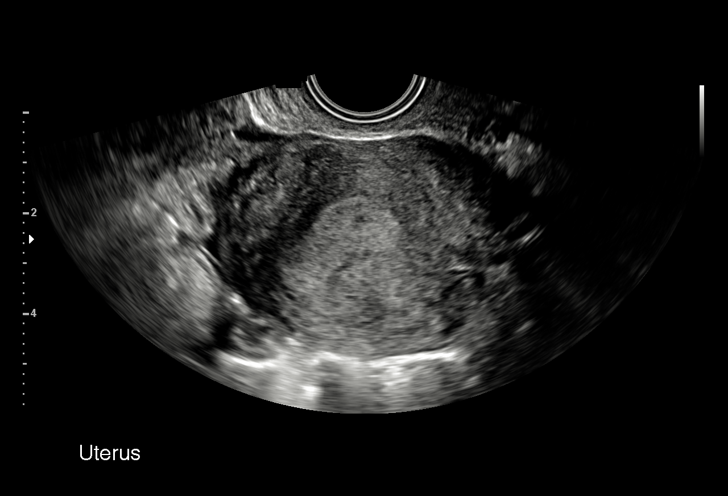
[im 39/62]
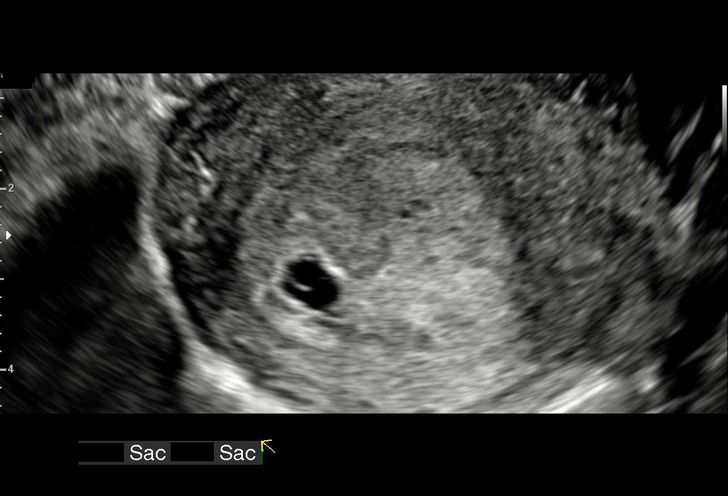
[im 43/62]
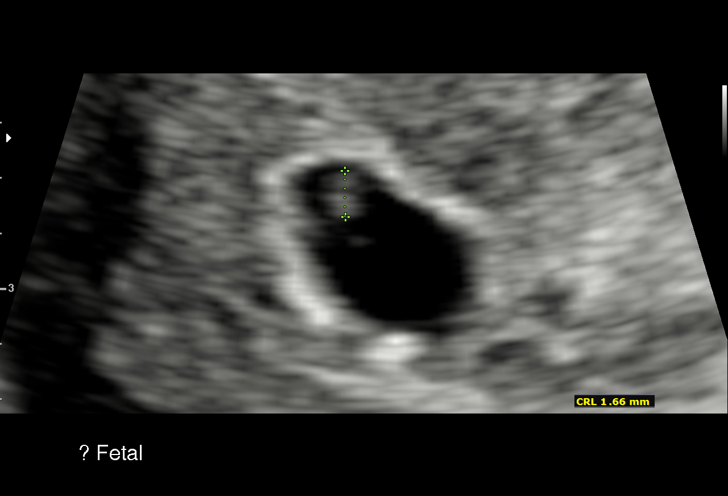
[im 48/62]
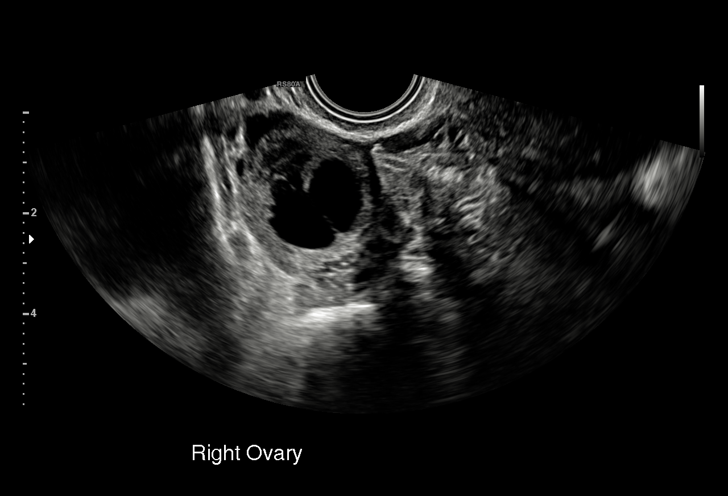
[im 52/62]
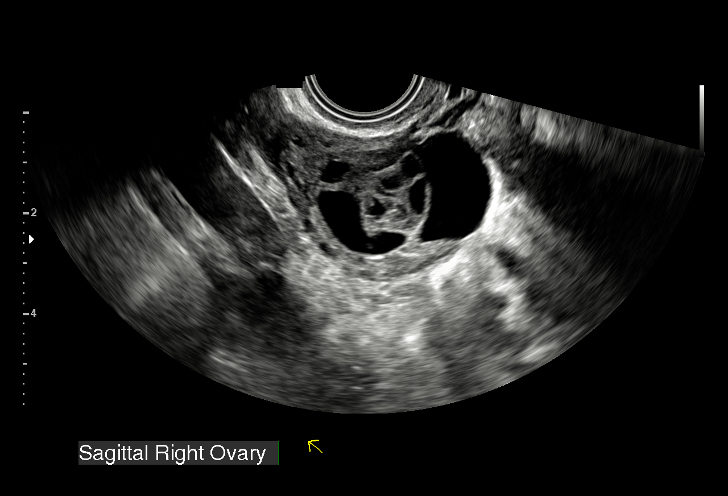
[im 57/62]
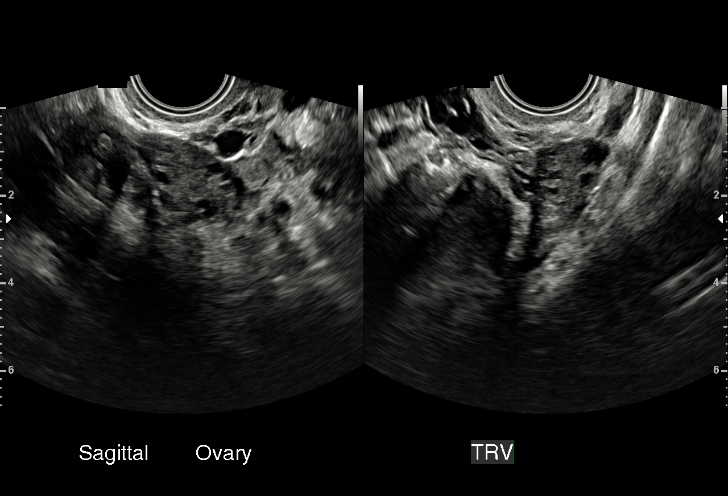
[im 62/62]
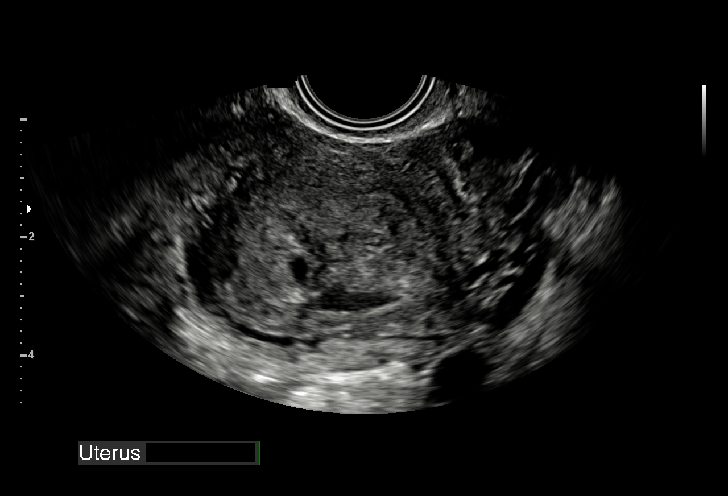

[15 of 28 positions shown; findings below may reference images not displayed]

FINDINGS: Intrauterine gestational sac: Single

Yolk sac:  Visualized.

Embryo:  Visualized.

Cardiac Activity: Not Visualized.

CRL:  2 mm; too small for dating; approximately 5 weeks

Subchorionic hemorrhage:  Moderate subchorionic hemorrhage noted.

Maternal uterus/adnexae: No fibroids identified. Normal appearance
of left ovary. A complex cystic and solid lesion is seen in the
right ovary which measures 3.6 cm. No internal blood flow is seen
within this lesion, likely representing a hemorrhagic corpus luteum.
No abnormal free-fluid.
IMPRESSION: Single IUP at approximately 5 weeks gestational age. Suggest
correlation with serial b-hCG levels, and consider followup
ultrasound to assess viability in 7-10 days.

Moderate subchorionic hemorrhage. Probable hemorrhagic right ovarian
corpus luteum. Recommend continued attention on follow-up
ultrasound.

## 2021-03-02 DIAGNOSIS — Z3491 Encounter for supervision of normal pregnancy, unspecified, first trimester: Secondary | ICD-10-CM | POA: Diagnosis not present

## 2021-03-02 DIAGNOSIS — O3680X9 Pregnancy with inconclusive fetal viability, other fetus: Secondary | ICD-10-CM | POA: Diagnosis not present

## 2021-03-02 DIAGNOSIS — N898 Other specified noninflammatory disorders of vagina: Secondary | ICD-10-CM | POA: Diagnosis not present

## 2021-03-02 DIAGNOSIS — Z3481 Encounter for supervision of other normal pregnancy, first trimester: Secondary | ICD-10-CM | POA: Diagnosis not present

## 2021-03-02 DIAGNOSIS — Z3A15 15 weeks gestation of pregnancy: Secondary | ICD-10-CM | POA: Diagnosis not present

## 2021-03-02 DIAGNOSIS — Z113 Encounter for screening for infections with a predominantly sexual mode of transmission: Secondary | ICD-10-CM | POA: Diagnosis not present

## 2021-03-02 DIAGNOSIS — Z363 Encounter for antenatal screening for malformations: Secondary | ICD-10-CM | POA: Diagnosis not present

## 2021-03-02 DIAGNOSIS — N912 Amenorrhea, unspecified: Secondary | ICD-10-CM | POA: Diagnosis not present

## 2021-04-22 DIAGNOSIS — Z363 Encounter for antenatal screening for malformations: Secondary | ICD-10-CM | POA: Diagnosis not present

## 2021-04-22 DIAGNOSIS — Z3A22 22 weeks gestation of pregnancy: Secondary | ICD-10-CM | POA: Diagnosis not present

## 2021-05-09 ENCOUNTER — Other Ambulatory Visit: Payer: Self-pay

## 2021-05-09 ENCOUNTER — Inpatient Hospital Stay (HOSPITAL_COMMUNITY)
Admission: AD | Admit: 2021-05-09 | Discharge: 2021-05-09 | Disposition: A | Discharge status: Home / Self Care | Payer: Medicaid Other | Attending: Obstetrics and Gynecology | Admitting: Obstetrics and Gynecology

## 2021-05-09 ENCOUNTER — Encounter (HOSPITAL_COMMUNITY): Payer: Self-pay | Admitting: Obstetrics and Gynecology

## 2021-05-09 DIAGNOSIS — O368121 Decreased fetal movements, second trimester, fetus 1: Secondary | ICD-10-CM

## 2021-05-09 DIAGNOSIS — O368120 Decreased fetal movements, second trimester, not applicable or unspecified: Secondary | ICD-10-CM | POA: Insufficient documentation

## 2021-05-09 DIAGNOSIS — Z3A24 24 weeks gestation of pregnancy: Secondary | ICD-10-CM | POA: Insufficient documentation

## 2021-05-09 NOTE — MAU Provider Note (Signed)
History     CSN: [id number]  Arrival date and time: 05/09/21 1243   Event Date/Time   First Provider Initiated Contact with Patient 05/09/21 1343      Chief Complaint  Patient presents with  . Decreased Fetal Movement   HPI Lori Moses 25 y.o. [redacted]w[redacted]d Was not feeling movement today - not the same as yesterday - so Lori Moses came in for evaluation.  Once Lori Moses was at the hospital, the baby started moving again and Lori Moses felt the movement.  Lori Moses had a good breakfast today and had been drinking fluids but the baby was not moving as well even after those interventions.    OB History    Gravida  2   Para  1   Term  1   Preterm  0   AB  0   Living  1     SAB  0   IAB  0   Ectopic  0   Multiple      Live Births  1           Past Medical History:  Diagnosis Date  . Acid reflux   . Anemia   . H/O repair of patent ductus arteriosus   . PID (acute pelvic inflammatory disease) 05/2017    Past Surgical History:  Procedure Laterality Date  . PATENT DUCTUS ARTERIOUS REPAIR     as an infant    Family History  Problem Relation Age of Onset  . Arthritis Mother   . Arthritis Maternal Grandmother   . Breast cancer Paternal Grandmother   . Down syndrome Sister   . Diabetes Maternal Uncle     Social History   Tobacco Use  . Smoking status: Never Smoker  . Smokeless tobacco: Never Used  Vaping Use  . Vaping Use: Never used  Substance Use Topics  . Alcohol use: No  . Drug use: No    Allergies:  Allergies  Allergen Reactions  . Flagyl [Metronidazole] Hives  . Other Hives    potatoes  . Penicillins Hives    Has patient had a PCN reaction causing immediate rash, facial/tongue/throat swelling, SOB or lightheadedness with hypotension: Yes Has patient had a PCN reaction causing severe rash involving mucus membranes or skin necrosis: No Has patient had a PCN reaction that required hospitalization No Has patient had a PCN reaction occurring within the last 10 years:  No If all of the above answers are "NO", then may proceed with Cephalosporin use.     Medications Prior to Admission  Medication Sig Dispense Refill Last Dose  . docusate sodium (COLACE) 250 MG capsule Take 1 capsule (250 mg total) by mouth daily. 30 capsule 1   . Prenatal Vit-Fe Phos-FA-Omega (VITAFOL GUMMIES) 3.33-0.333-34.8 MG CHEW Chew 1 tablet by mouth daily. 90 tablet 5     Review of Systems  Constitutional: Negative for fever.  Gastrointestinal: Negative for abdominal pain, nausea and vomiting.  Genitourinary: Negative for dysuria, vaginal bleeding and vaginal discharge.       Decreased fetal movement   Physical Exam   Blood pressure 122/75, pulse (!) 103, temperature 98.1 F (36.7 C), temperature source Oral, resp. rate 17, height 5\' 7"  (1.702 m), weight 78.8 kg, last menstrual period 11/17/2020, SpO2 100 %, not currently breastfeeding.  Physical Exam Vitals and nursing note reviewed.  Constitutional:      Appearance: Lori Moses is well-developed.  HENT:     Head: Normocephalic.  Abdominal:     Palpations: Abdomen is soft.  Tenderness: There is no abdominal tenderness. There is no guarding or rebound.     Comments: NST FHT 155 with moderate variability Accelerations not noted but well within normal tracing for 21 weeks Provider can hear multiple fetal movements and client can feel repeated movements at this time. Reassuring tracing  Musculoskeletal:        General: Normal range of motion.     Cervical back: Neck supple.  Skin:    General: Skin is warm and dry.  Neurological:     Mental Status: Lori Moses is alert and oriented to person, place, and time.     MAU Course  Procedures  MDM Is doing well at this time and feeling lots more fetal movement.  Will discharge home.  Assessment and Plan  Decreased fetal movement that has now resolved.  Plan Drink at least 8 8-oz glasses of water every day. Be seen in the office this week if you are continuing to have any  problems. Return here to the hospital if the baby is not moving well before Monday.   Chinyere Galiano L Jacklyne Baik 05/09/2021, 2:35 PM

## 2021-05-09 NOTE — Discharge Instructions (Signed)
Be seen in the office this week if you are continuing to have any problems. Return here to the hospital if the baby is not moving well before Monday. Drink at least 8 8-oz glasses of water every day.

## 2021-05-09 NOTE — MAU Note (Signed)
Daughter hasn't been moving as much as she usually do, only felt like 5 movement since 3 this morning. Denies pain, bleeding or leaking.  Denies recent falls or trauma.

## 2021-05-15 ENCOUNTER — Encounter (HOSPITAL_COMMUNITY): Payer: Self-pay | Admitting: *Deleted

## 2021-05-15 ENCOUNTER — Inpatient Hospital Stay (HOSPITAL_COMMUNITY)
Admission: AD | Admit: 2021-05-15 | Discharge: 2021-05-15 | Disposition: A | Discharge status: Home / Self Care | Payer: Medicaid Other | Attending: Obstetrics and Gynecology | Admitting: Obstetrics and Gynecology

## 2021-05-15 ENCOUNTER — Other Ambulatory Visit: Payer: Self-pay

## 2021-05-15 DIAGNOSIS — O26852 Spotting complicating pregnancy, second trimester: Secondary | ICD-10-CM | POA: Insufficient documentation

## 2021-05-15 DIAGNOSIS — B9689 Other specified bacterial agents as the cause of diseases classified elsewhere: Secondary | ICD-10-CM | POA: Diagnosis not present

## 2021-05-15 DIAGNOSIS — O23592 Infection of other part of genital tract in pregnancy, second trimester: Secondary | ICD-10-CM | POA: Insufficient documentation

## 2021-05-15 DIAGNOSIS — Z3A25 25 weeks gestation of pregnancy: Secondary | ICD-10-CM | POA: Diagnosis not present

## 2021-05-15 DIAGNOSIS — Z79899 Other long term (current) drug therapy: Secondary | ICD-10-CM | POA: Insufficient documentation

## 2021-05-15 DIAGNOSIS — N760 Acute vaginitis: Secondary | ICD-10-CM

## 2021-05-15 DIAGNOSIS — Z881 Allergy status to other antibiotic agents status: Secondary | ICD-10-CM | POA: Diagnosis not present

## 2021-05-15 LAB — URINALYSIS, ROUTINE W REFLEX MICROSCOPIC
Bilirubin Urine: NEGATIVE
Glucose, UA: NEGATIVE mg/dL
Ketones, ur: NEGATIVE mg/dL
Leukocytes,Ua: NEGATIVE
Nitrite: NEGATIVE
Protein, ur: NEGATIVE mg/dL
Specific Gravity, Urine: 1.004 — ABNORMAL LOW (ref 1.005–1.030)
pH: 7 (ref 5.0–8.0)

## 2021-05-15 LAB — WET PREP, GENITAL
Sperm: NONE SEEN
Trich, Wet Prep: NONE SEEN
Yeast Wet Prep HPF POC: NONE SEEN

## 2021-05-15 MED ORDER — CLINDAMYCIN HCL 300 MG PO CAPS: 300.0000 mg | ORAL_CAPSULE | Freq: Two times a day (BID) | ORAL | 0 refills | Status: DC

## 2021-05-15 NOTE — MAU Note (Signed)
Patient was using the bathroom to have a BM around noon today and noticed some "vaginal spotting."  Pt reports it was light pink and only when she wiped.  Denies any pain.  Reports good fetal movement.  No recent SI.

## 2021-05-15 NOTE — MAU Provider Note (Signed)
History     CSN: [id number]  Arrival date and time: 05/15/21 1323   Event Date/Time   First Provider Initiated Contact with Patient 05/15/21 1438      Chief Complaint  Patient presents with   Vaginal Bleeding   HPI  Nothing spotting today at 12 noon today. She was having a BM and she noticed the spotting only when she wiped. She was not straining to have a BM. She has not had constipation. She wiped 5x and saw it every time. She reports it did get lighter every time she wiped.  + fetal movement. No pain. No blood clots.   OB History     Gravida  2   Para  1   Term  1   Preterm  0   AB  0   Living  1      SAB  0   IAB  0   Ectopic  0   Multiple      Live Births  1           Past Medical History:  Diagnosis Date   Acid reflux    Anemia    H/O repair of patent ductus arteriosus    PID (acute pelvic inflammatory disease) 05/2017    Past Surgical History:  Procedure Laterality Date   PATENT DUCTUS ARTERIOUS REPAIR     as an infant    Family History  Problem Relation Age of Onset   Arthritis Mother    Arthritis Maternal Grandmother    Breast cancer Paternal Grandmother    Down syndrome Sister    Diabetes Maternal Uncle     Social History   Tobacco Use   Smoking status: Never   Smokeless tobacco: Never  Vaping Use   Vaping Use: Never used  Substance Use Topics   Alcohol use: No   Drug use: No    Allergies:  Allergies  Allergen Reactions   Flagyl [Metronidazole] Hives   Other Hives    potatoes   Penicillins Hives    Has patient had a PCN reaction causing immediate rash, facial/tongue/throat swelling, SOB or lightheadedness with hypotension: Yes Has patient had a PCN reaction causing severe rash involving mucus membranes or skin necrosis: No Has patient had a PCN reaction that required hospitalization No Has patient had a PCN reaction occurring within the last 10 years: No If all of the above answers are "NO", then may proceed  with Cephalosporin use.     Medications Prior to Admission  Medication Sig Dispense Refill Last Dose   docusate sodium (COLACE) 250 MG capsule Take 1 capsule (250 mg total) by mouth daily. 30 capsule 1    Prenatal Vit-Fe Phos-FA-Omega (VITAFOL GUMMIES) 3.33-0.333-34.8 MG CHEW Chew 1 tablet by mouth daily. 90 tablet 5    Results for orders placed or performed during the hospital encounter of 05/15/21 (from the past 48 hour(s))  Wet prep, genital     Status: Abnormal   Collection Time: 05/15/21  3:00 PM   Specimen: PATH Cytology Cervicovaginal Ancillary Only  Result Value Ref Range   Yeast Wet Prep HPF POC NONE SEEN NONE SEEN   Trich, Wet Prep NONE SEEN NONE SEEN   Clue Cells Wet Prep HPF POC PRESENT (A) NONE SEEN   WBC, Wet Prep HPF POC MANY (A) NONE SEEN   Sperm NONE SEEN     Comment: Performed at [facility], [address], [place], [place] [address]  Urinalysis, Routine w reflex microscopic  Urine, Clean Catch     Status: Abnormal   Collection Time: 05/15/21  3:03 PM  Result Value Ref Range   Color, Urine STRAW (A) YELLOW   APPearance HAZY (A) CLEAR   Specific Gravity, Urine 1.004 (L) 1.005 - 1.030   pH 7.0 5.0 - 8.0   Glucose, UA NEGATIVE NEGATIVE mg/dL   Hgb urine dipstick SMALL (A) NEGATIVE   Bilirubin Urine NEGATIVE NEGATIVE   Ketones, ur NEGATIVE NEGATIVE mg/dL   Protein, ur NEGATIVE NEGATIVE mg/dL   Nitrite NEGATIVE NEGATIVE   Leukocytes,Ua NEGATIVE NEGATIVE   RBC / HPF 0-5 0 - 5 RBC/hpf   WBC, UA 0-5 0 - 5 WBC/hpf   Bacteria, UA MANY (A) NONE SEEN   Squamous Epithelial / LPF 0-5 0 - 5    Comment: Performed at [facility], [address], [place], [place] [address]    Review of Systems  Constitutional:  Negative for fever.  Gastrointestinal:  Negative for abdominal pain.  Genitourinary:  Positive for vaginal bleeding and vaginal discharge.  Physical Exam   Blood pressure 127/65, pulse 91, temperature 98.5 F (36.9 C), temperature source Oral, resp.  rate 16, last menstrual period 11/17/2020, SpO2 100 %, not currently breastfeeding.  Physical Exam Constitutional:      General: She is not in acute distress.    Appearance: Normal appearance. She is not ill-appearing, toxic-appearing or diaphoretic.  Genitourinary:    Comments: Vagina - Small amount of white vaginal discharge, no odor  Cervix - scant contact bleeding, no active bleeding, no blood in the vault.  Bimanual exam: Cervix closed, thick, long.  GC/Chlam, wet prep done Chaperone present for exam.   Neurological:     Mental Status: She is alert.   Fetal Tracing: Baseline: 150 bpm  Variability: Moderate  Accelerations: 10x10 Decelerations: None  Toco: quiet   MAU Course  Procedures None  MDM  Wet prep and GC No blood noted on exam O positive blood type.   Assessment and Plan   A:  1. Bacterial vaginosis   2. [redacted] weeks gestation of pregnancy      P:  Discharge home in stable condition Rx: Clindamycin ( allergy to flagyl ) Return to MAU if symptoms worsen Pelvic rest    Birdie Beveridge, Harolyn Rutherford, NP 05/15/2021 7:06 PM

## 2021-05-17 LAB — GC/CHLAMYDIA PROBE AMP (~~LOC~~) NOT AT ARMC
Chlamydia: NEGATIVE
Comment: NEGATIVE
Comment: NORMAL
Neisseria Gonorrhea: NEGATIVE

## 2021-05-19 DIAGNOSIS — Q211 Atrial septal defect: Secondary | ICD-10-CM | POA: Diagnosis not present

## 2021-05-19 DIAGNOSIS — Q250 Patent ductus arteriosus: Secondary | ICD-10-CM | POA: Diagnosis not present

## 2021-05-25 DIAGNOSIS — Z3A26 26 weeks gestation of pregnancy: Secondary | ICD-10-CM | POA: Diagnosis not present

## 2021-05-25 DIAGNOSIS — Z3491 Encounter for supervision of normal pregnancy, unspecified, first trimester: Secondary | ICD-10-CM | POA: Diagnosis not present

## 2021-05-25 DIAGNOSIS — Z362 Encounter for other antenatal screening follow-up: Secondary | ICD-10-CM | POA: Diagnosis not present

## 2021-07-16 DIAGNOSIS — Z113 Encounter for screening for infections with a predominantly sexual mode of transmission: Secondary | ICD-10-CM | POA: Diagnosis not present

## 2021-07-16 DIAGNOSIS — O368921 Maternal care for other specified fetal problems, second trimester, fetus 1: Secondary | ICD-10-CM | POA: Diagnosis not present

## 2021-07-16 DIAGNOSIS — B373 Candidiasis of vulva and vagina: Secondary | ICD-10-CM | POA: Diagnosis not present

## 2021-07-16 DIAGNOSIS — Z331 Pregnant state, incidental: Secondary | ICD-10-CM | POA: Diagnosis not present

## 2021-07-16 DIAGNOSIS — Z8279 Family history of other congenital malformations, deformations and chromosomal abnormalities: Secondary | ICD-10-CM | POA: Diagnosis not present

## 2021-07-16 DIAGNOSIS — Z3A34 34 weeks gestation of pregnancy: Secondary | ICD-10-CM | POA: Diagnosis not present

## 2021-07-16 DIAGNOSIS — D6949 Other primary thrombocytopenia: Secondary | ICD-10-CM | POA: Diagnosis not present

## 2021-07-16 DIAGNOSIS — Z9889 Other specified postprocedural states: Secondary | ICD-10-CM | POA: Diagnosis not present

## 2021-07-16 DIAGNOSIS — Z3483 Encounter for supervision of other normal pregnancy, third trimester: Secondary | ICD-10-CM | POA: Diagnosis not present

## 2021-07-27 DIAGNOSIS — Z3483 Encounter for supervision of other normal pregnancy, third trimester: Secondary | ICD-10-CM | POA: Diagnosis not present

## 2021-07-27 DIAGNOSIS — R7989 Other specified abnormal findings of blood chemistry: Secondary | ICD-10-CM | POA: Diagnosis not present

## 2021-08-04 DIAGNOSIS — Z3483 Encounter for supervision of other normal pregnancy, third trimester: Secondary | ICD-10-CM | POA: Diagnosis not present

## 2021-08-04 DIAGNOSIS — N898 Other specified noninflammatory disorders of vagina: Secondary | ICD-10-CM | POA: Diagnosis not present

## 2021-08-04 DIAGNOSIS — Z3A36 36 weeks gestation of pregnancy: Secondary | ICD-10-CM | POA: Diagnosis not present

## 2021-08-04 DIAGNOSIS — O368921 Maternal care for other specified fetal problems, second trimester, fetus 1: Secondary | ICD-10-CM | POA: Diagnosis not present

## 2021-08-04 DIAGNOSIS — Z331 Pregnant state, incidental: Secondary | ICD-10-CM | POA: Diagnosis not present

## 2021-08-04 DIAGNOSIS — Z9889 Other specified postprocedural states: Secondary | ICD-10-CM | POA: Diagnosis not present

## 2021-08-04 DIAGNOSIS — D6949 Other primary thrombocytopenia: Secondary | ICD-10-CM | POA: Diagnosis not present

## 2021-08-09 ENCOUNTER — Encounter (HOSPITAL_COMMUNITY): Payer: Self-pay | Admitting: Obstetrics & Gynecology

## 2021-08-09 ENCOUNTER — Inpatient Hospital Stay (HOSPITAL_COMMUNITY)
Admission: AD | Admit: 2021-08-09 | Discharge: 2021-08-09 | Disposition: A | Discharge status: Home / Self Care | Payer: Medicaid Other | Attending: Obstetrics & Gynecology | Admitting: Obstetrics & Gynecology

## 2021-08-09 ENCOUNTER — Other Ambulatory Visit: Payer: Self-pay

## 2021-08-09 DIAGNOSIS — Y9301 Activity, walking, marching and hiking: Secondary | ICD-10-CM | POA: Diagnosis not present

## 2021-08-09 DIAGNOSIS — Y92009 Unspecified place in unspecified non-institutional (private) residence as the place of occurrence of the external cause: Secondary | ICD-10-CM

## 2021-08-09 DIAGNOSIS — W19XXXA Unspecified fall, initial encounter: Secondary | ICD-10-CM | POA: Diagnosis not present

## 2021-08-09 DIAGNOSIS — Z880 Allergy status to penicillin: Secondary | ICD-10-CM | POA: Insufficient documentation

## 2021-08-09 DIAGNOSIS — W1830XA Fall on same level, unspecified, initial encounter: Secondary | ICD-10-CM | POA: Insufficient documentation

## 2021-08-09 DIAGNOSIS — O9A213 Injury, poisoning and certain other consequences of external causes complicating pregnancy, third trimester: Secondary | ICD-10-CM | POA: Diagnosis not present

## 2021-08-09 DIAGNOSIS — Z881 Allergy status to other antibiotic agents status: Secondary | ICD-10-CM | POA: Diagnosis not present

## 2021-08-09 DIAGNOSIS — Z3A37 37 weeks gestation of pregnancy: Secondary | ICD-10-CM | POA: Diagnosis not present

## 2021-08-09 DIAGNOSIS — Z3689 Encounter for other specified antenatal screening: Secondary | ICD-10-CM

## 2021-08-09 LAB — URINALYSIS, ROUTINE W REFLEX MICROSCOPIC
Bilirubin Urine: NEGATIVE
Glucose, UA: NEGATIVE mg/dL
Hgb urine dipstick: NEGATIVE
Ketones, ur: 5 mg/dL — AB
Nitrite: NEGATIVE
Protein, ur: NEGATIVE mg/dL
Specific Gravity, Urine: 1.017 (ref 1.005–1.030)
pH: 6 (ref 5.0–8.0)

## 2021-08-09 NOTE — MAU Note (Signed)
...  Lori Moses is a 25 y.o. at [redacted]w[redacted]d here in MAU reporting: fell at 0600 this morning while walking to the restroom. She states she has been having problems with her legs and her left leg gave out and she fell onto her right knee and over onto her right side. She denies hitting her head or her abdomen. +FM. No VB or LOF. Endorses lower pelvic pressure that has been going on for one week now. States her right knee does not hurt any more.  Pain score:  0/10 right knee 8/10 pelvic pressure    FHT: 141 doppler Lab orders placed from triage: UA

## 2021-08-09 NOTE — MAU Provider Note (Signed)
History     CSN: [id number]  Arrival date and time: 08/09/21 1093   Event Date/Time   First Provider Initiated Contact with Patient 08/09/21 [phone]      Chief Complaint  Patient presents with   Leg Pain   Pelvic Pain   Lori Moses is a 25 y.o. year old G65P1001 female at [redacted]w[redacted]d weeks gestation who presents to MAU reporting she fell while walking to the BR at 0600 this morning. She reports "having problems with legs and her leg "gave out" causing her to fall. She fell on her RT knee and then over to her RT side. She denies hitting her head or abdomen. She reports good (+) FM. She reports lower pelvic pressure x 1 week. She denies any pain in her RT knee or any other location. She denies any injuries from the fall. She receives [facility] with [facility]; next appt 08/11/21.   OB History     Gravida  2   Para  1   Term  1   Preterm  0   AB  0   Living  1      SAB  0   IAB  0   Ectopic  0   Multiple      Live Births  1           Past Medical History:  Diagnosis Date   Acid reflux    Anemia    H/O repair of patent ductus arteriosus    PID (acute pelvic inflammatory disease) 05/2017    Past Surgical History:  Procedure Laterality Date   PATENT DUCTUS ARTERIOUS REPAIR     as an infant    Family History  Problem Relation Age of Onset   Arthritis Mother    Arthritis Maternal Grandmother    Breast cancer Paternal Grandmother    Down syndrome Sister    Diabetes Maternal Uncle     Social History   Tobacco Use   Smoking status: Never   Smokeless tobacco: Never  Vaping Use   Vaping Use: Never used  Substance Use Topics   Alcohol use: No   Drug use: No    Allergies:  Allergies  Allergen Reactions   Flagyl [Metronidazole] Hives   Other Hives    potatoes   Penicillins Hives    Has patient had a PCN reaction causing immediate rash, facial/tongue/throat swelling, SOB or lightheadedness with hypotension: Yes Has patient had a PCN reaction causing severe  rash involving mucus membranes or skin necrosis: No Has patient had a PCN reaction that required hospitalization No Has patient had a PCN reaction occurring within the last 10 years: No If all of the above answers are "NO", then may proceed with Cephalosporin use.     Medications Prior to Admission  Medication Sig Dispense Refill Last Dose   Prenatal Vit-Fe Phos-FA-Omega (VITAFOL GUMMIES) 3.33-0.333-34.8 MG CHEW Chew 1 tablet by mouth daily. 90 tablet 5 08/08/2021   clindamycin (CLEOCIN) 300 MG capsule Take 1 capsule (300 mg total) by mouth 2 (two) times daily. 14 capsule 0    docusate sodium (COLACE) 250 MG capsule Take 1 capsule (250 mg total) by mouth daily. 30 capsule 1     Review of Systems  Constitutional: Negative.   HENT: Negative.    Eyes: Negative.   Respiratory: Negative.    Cardiovascular: Negative.   Gastrointestinal: Negative.   Endocrine: Negative.   Genitourinary: Negative.   Musculoskeletal: Negative.   Skin: Negative.   Allergic/Immunologic: Negative.  Neurological: Negative.   Hematological: Negative.   Psychiatric/Behavioral: Negative.    Physical Exam   Blood pressure 121/78, pulse (!) 105, temperature 98 F (36.7 C), temperature source Oral, resp. rate 19, height 5\' 7"  (1.702 m), weight 84 kg, last menstrual period 11/17/2020, SpO2 100 %, not currently breastfeeding.  Physical Exam Vitals and nursing note reviewed.  Constitutional:      Appearance: Normal appearance. She is normal weight.  HENT:     Head: Normocephalic and atraumatic.  Eyes:     Extraocular Movements: Extraocular movements intact.     Conjunctiva/sclera: Conjunctivae normal.     Pupils: Pupils are equal, round, and reactive to light.  Cardiovascular:     Rate and Rhythm: Tachycardia present.  Pulmonary:     Effort: Pulmonary effort is normal.  Abdominal:     Palpations: Abdomen is soft.  Genitourinary:    Comments: Not indicated Musculoskeletal:        General: No swelling,  tenderness, deformity or signs of injury. Normal range of motion.     Cervical back: Normal range of motion and neck supple.     Right lower leg: No edema.     Left lower leg: No edema.  Skin:    General: Skin is warm and dry.  Neurological:     General: No focal deficit present.     Mental Status: She is alert and oriented to person, place, and time. Mental status is at baseline.  Psychiatric:        Mood and Affect: Mood normal.        Behavior: Behavior normal.        Thought Content: Thought content normal.        Judgment: Judgment normal.   REACTIVE NST - FHR: 140 bpm / moderate variability / accels present / decels absent / TOCO: 1 UC with UI noted MAU Course  Procedures  MDM CCUA CEFM  Results for orders placed or performed during the hospital encounter of 08/09/21 (from the past 72 hour(s))  Urinalysis, Routine w reflex microscopic Urine, Clean Catch     Status: Abnormal   Collection Time: 08/09/21  8:51 AM  Result Value Ref Range   Color, Urine YELLOW YELLOW   APPearance HAZY (A) CLEAR   Specific Gravity, Urine 1.017 1.005 - 1.030   pH 6.0 5.0 - 8.0   Glucose, UA NEGATIVE NEGATIVE mg/dL   Hgb urine dipstick NEGATIVE NEGATIVE   Bilirubin Urine NEGATIVE NEGATIVE   Ketones, ur 5 (A) NEGATIVE mg/dL   Protein, ur NEGATIVE NEGATIVE mg/dL   Nitrite NEGATIVE NEGATIVE   Leukocytes,Ua LARGE (A) NEGATIVE   RBC / HPF 6-10 0 - 5 RBC/hpf   WBC, UA 21-50 0 - 5 WBC/hpf   Bacteria, UA MANY (A) NONE SEEN   Squamous Epithelial / LPF 6-10 0 - 5   Mucus PRESENT     Comment: Performed at [facility], [address], [place], [place]    Assessment and Plan  Fall at home, initial encounter  - Information provided on preventing injuries in pregnancy   [redacted] weeks gestation of pregnancy  NST (non-stress test) reactive  - Reassurance of fetal well-being - Explained the reactive FHR tracing - Reviewed [facility]  - Discharge patient - Keep scheduled appt with [facility] on  08/11/2021 - Patient verbalized an understanding of the plan of care and agrees.    10/11/2021, CNM 08/09/2021, 9:36 AM

## 2021-08-21 ENCOUNTER — Inpatient Hospital Stay (EMERGENCY_DEPARTMENT_HOSPITAL)
Admission: AD | Admit: 2021-08-21 | Discharge: 2021-08-21 | Disposition: A | Discharge status: Home / Self Care | Payer: Medicaid Other | Source: Home / Self Care | Attending: Obstetrics & Gynecology | Admitting: Obstetrics & Gynecology

## 2021-08-21 ENCOUNTER — Encounter (HOSPITAL_COMMUNITY): Payer: Self-pay | Admitting: Obstetrics & Gynecology

## 2021-08-21 ENCOUNTER — Other Ambulatory Visit: Payer: Self-pay

## 2021-08-21 ENCOUNTER — Inpatient Hospital Stay (HOSPITAL_COMMUNITY)
Admission: AD | Admit: 2021-08-21 | Discharge: 2021-08-23 | DRG: 807 | Disposition: A | Discharge status: Home / Self Care | Payer: Medicaid Other | Attending: Obstetrics and Gynecology | Admitting: Obstetrics and Gynecology

## 2021-08-21 DIAGNOSIS — Z3689 Encounter for other specified antenatal screening: Secondary | ICD-10-CM

## 2021-08-21 DIAGNOSIS — O471 False labor at or after 37 completed weeks of gestation: Secondary | ICD-10-CM | POA: Insufficient documentation

## 2021-08-21 DIAGNOSIS — O9912 Other diseases of the blood and blood-forming organs and certain disorders involving the immune mechanism complicating childbirth: Principal | ICD-10-CM | POA: Diagnosis present

## 2021-08-21 DIAGNOSIS — Z3A39 39 weeks gestation of pregnancy: Secondary | ICD-10-CM | POA: Diagnosis not present

## 2021-08-21 DIAGNOSIS — O479 False labor, unspecified: Secondary | ICD-10-CM

## 2021-08-21 DIAGNOSIS — Z20822 Contact with and (suspected) exposure to covid-19: Secondary | ICD-10-CM | POA: Diagnosis present

## 2021-08-21 DIAGNOSIS — Z880 Allergy status to penicillin: Secondary | ICD-10-CM

## 2021-08-21 DIAGNOSIS — D6949 Other primary thrombocytopenia: Secondary | ICD-10-CM | POA: Diagnosis present

## 2021-08-21 NOTE — MAU Provider Note (Signed)
Event Date/Time   First Provider Initiated Contact with Patient 08/21/21 0409      S: [name] is a 25 y.o. G2P1001 at [redacted]w[redacted]d  who presents to MAU today complaining contractions q 4 minutes since 9pm. She denies vaginal bleeding. She denies LOF. She reports normal fetal movement.    O: BP 129/86   Pulse 74   Temp 98.1 F (36.7 C)   Resp 18   Wt 85.3 kg   LMP 11/17/2020   SpO2 100%   BMI 29.44 kg/m  GENERAL: Well-developed, well-nourished female in no acute distress.  HEAD: Normocephalic, atraumatic.  CHEST: Normal effort of breathing, regular heart rate ABDOMEN: Soft, nontender, gravid  Cervical exam:  Dilation: 2.5 Effacement (%): 80 Cervical Position: Middle Station: -1 Presentation: Vertex Exam by:: Santiago Bur, RN   Fetal Monitoring: FHT:  Baseline 135 , moderate variability, accelerations present, no decelerations Contractions: q 2-7 mins Irregular      Assessment: Single IUP @ [redacted]w[redacted]d Uterine contractions in pregnancy False Labor/Not in Labor Reactive Fetal Heart Rate Tracing   Plan: D/C home with labor precautions Keep scheduled appt with office  Encouraged to return if she develops worsening of symptoms, increase in pain, fever, or other concerning symptoms.      Aviva Signs, CNM 08/21/2021 4:09 AM

## 2021-08-21 NOTE — MAU Note (Signed)
Pt reports ctx q 4 min for the last 3 hours but have slowed down a little now that she is here. Good fetal movement elt. Denies any vag bleeding  or leaking at this time.

## 2021-08-22 ENCOUNTER — Encounter (HOSPITAL_COMMUNITY): Payer: Self-pay | Admitting: Obstetrics and Gynecology

## 2021-08-22 DIAGNOSIS — Z880 Allergy status to penicillin: Secondary | ICD-10-CM | POA: Diagnosis not present

## 2021-08-22 DIAGNOSIS — Z3A39 39 weeks gestation of pregnancy: Secondary | ICD-10-CM | POA: Diagnosis not present

## 2021-08-22 DIAGNOSIS — Z20822 Contact with and (suspected) exposure to covid-19: Secondary | ICD-10-CM | POA: Diagnosis not present

## 2021-08-22 DIAGNOSIS — D6949 Other primary thrombocytopenia: Secondary | ICD-10-CM | POA: Diagnosis not present

## 2021-08-22 DIAGNOSIS — O9912 Other diseases of the blood and blood-forming organs and certain disorders involving the immune mechanism complicating childbirth: Secondary | ICD-10-CM | POA: Diagnosis not present

## 2021-08-22 DIAGNOSIS — O26893 Other specified pregnancy related conditions, third trimester: Secondary | ICD-10-CM | POA: Diagnosis not present

## 2021-08-22 LAB — CBC
HCT: 33.9 % — ABNORMAL LOW (ref 36.0–46.0)
HCT: 33.4 % — ABNORMAL LOW (ref 36.0–46.0)
Hemoglobin: 10.7 g/dL — ABNORMAL LOW (ref 12.0–15.0)
Hemoglobin: 11 g/dL — ABNORMAL LOW (ref 12.0–15.0)
MCH: 25.8 pg — ABNORMAL LOW (ref 26.0–34.0)
MCH: 26.4 pg (ref 26.0–34.0)
MCHC: 31.6 g/dL (ref 30.0–36.0)
MCHC: 32.9 g/dL (ref 30.0–36.0)
MCV: 81.9 fL (ref 80.0–100.0)
MCV: 80.3 fL (ref 80.0–100.0)
Platelets: 121 10*3/uL — ABNORMAL LOW (ref 150–400)
Platelets: 125 10*3/uL — ABNORMAL LOW (ref 150–400)
RBC: 4.14 MIL/uL (ref 3.87–5.11)
RBC: 4.16 MIL/uL (ref 3.87–5.11)
RDW: 14.4 % (ref 11.5–15.5)
RDW: 14.1 % (ref 11.5–15.5)
WBC: 6.6 10*3/uL (ref 4.0–10.5)
WBC: 9.6 10*3/uL (ref 4.0–10.5)
nRBC: 0 % (ref 0.0–0.2)
nRBC: 0 % (ref 0.0–0.2)

## 2021-08-22 LAB — TYPE AND SCREEN
ABO/RH(D): O POS
Antibody Screen: NEGATIVE

## 2021-08-22 LAB — RPR: RPR Ser Ql: NONREACTIVE

## 2021-08-22 LAB — RESP PANEL BY RT-PCR (FLU A&B, COVID) ARPGX2
Influenza A by PCR: NEGATIVE
Influenza B by PCR: NEGATIVE
SARS Coronavirus 2 by RT PCR: NEGATIVE

## 2021-08-22 MED ORDER — SIMETHICONE 80 MG PO CHEW: 80.0000 mg | CHEWABLE_TABLET | ORAL | Status: DC | PRN

## 2021-08-22 MED ORDER — FENTANYL CITRATE (PF) 100 MCG/2ML IJ SOLN
50.0000 ug | INTRAMUSCULAR | Status: DC | PRN
  Administered 2021-08-22: 100 ug via INTRAVENOUS
  Filled 2021-08-22: qty 2

## 2021-08-22 MED ORDER — PRENATAL MULTIVITAMIN CH
1.0000 | ORAL_TABLET | Freq: Every day | ORAL | Status: DC
  Administered 2021-08-22: 1 via ORAL
  Filled 2021-08-22: qty 1

## 2021-08-22 MED ORDER — DIPHENHYDRAMINE HCL 25 MG PO CAPS: 25.0000 mg | ORAL_CAPSULE | Freq: Four times a day (QID) | ORAL | Status: DC | PRN

## 2021-08-22 MED ORDER — SENNOSIDES-DOCUSATE SODIUM 8.6-50 MG PO TABS: 2.0000 | ORAL_TABLET | Freq: Every day | ORAL | Status: DC

## 2021-08-22 MED ORDER — ONDANSETRON HCL 4 MG PO TABS: 4.0000 mg | ORAL_TABLET | ORAL | Status: DC | PRN

## 2021-08-22 MED ORDER — LIDOCAINE HCL (PF) 1 % IJ SOLN
30.0000 mL | INTRAMUSCULAR | Status: AC | PRN
  Administered 2021-08-22: 30 mL via SUBCUTANEOUS
  Filled 2021-08-22: qty 30

## 2021-08-22 MED ORDER — LACTATED RINGERS IV SOLN: INTRAVENOUS | Status: DC

## 2021-08-22 MED ORDER — COCONUT OIL OIL: 1.0000 "application " | TOPICAL_OIL | Status: DC | PRN

## 2021-08-22 MED ORDER — ONDANSETRON HCL 4 MG/2ML IJ SOLN: 4.0000 mg | Freq: Four times a day (QID) | INTRAMUSCULAR | Status: DC | PRN

## 2021-08-22 MED ORDER — LACTATED RINGERS IV SOLN: 500.0000 mL | INTRAVENOUS | Status: DC | PRN

## 2021-08-22 MED ORDER — DIBUCAINE (PERIANAL) 1 % EX OINT: 1.0000 "application " | TOPICAL_OINTMENT | CUTANEOUS | Status: DC | PRN

## 2021-08-22 MED ORDER — OXYTOCIN BOLUS FROM INFUSION
333.0000 mL | Freq: Once | INTRAVENOUS | Status: AC
  Administered 2021-08-22: 333 mL via INTRAVENOUS

## 2021-08-22 MED ORDER — WITCH HAZEL-GLYCERIN EX PADS: 1.0000 "application " | MEDICATED_PAD | CUTANEOUS | Status: DC | PRN

## 2021-08-22 MED ORDER — OXYCODONE-ACETAMINOPHEN 5-325 MG PO TABS: 1.0000 | ORAL_TABLET | ORAL | Status: DC | PRN

## 2021-08-22 MED ORDER — ACETAMINOPHEN 325 MG PO TABS: 650.0000 mg | ORAL_TABLET | ORAL | Status: DC | PRN

## 2021-08-22 MED ORDER — TETANUS-DIPHTH-ACELL PERTUSSIS 5-2.5-18.5 LF-MCG/0.5 IM SUSY: 0.5000 mL | PREFILLED_SYRINGE | Freq: Once | INTRAMUSCULAR | Status: DC

## 2021-08-22 MED ORDER — OXYCODONE-ACETAMINOPHEN 5-325 MG PO TABS: 2.0000 | ORAL_TABLET | ORAL | Status: DC | PRN

## 2021-08-22 MED ORDER — ONDANSETRON HCL 4 MG/2ML IJ SOLN: 4.0000 mg | INTRAMUSCULAR | Status: DC | PRN

## 2021-08-22 MED ORDER — OXYTOCIN-SODIUM CHLORIDE 30-0.9 UT/500ML-% IV SOLN
2.5000 [IU]/h | INTRAVENOUS | Status: DC
  Administered 2021-08-22: 2.5 [IU]/h via INTRAVENOUS
  Filled 2021-08-22: qty 500

## 2021-08-22 MED ORDER — BENZOCAINE-MENTHOL 20-0.5 % EX AERO
1.0000 "application " | INHALATION_SPRAY | CUTANEOUS | Status: DC | PRN
  Administered 2021-08-22: 1 via TOPICAL
  Filled 2021-08-22: qty 56

## 2021-08-22 MED ORDER — IBUPROFEN 600 MG PO TABS
600.0000 mg | ORAL_TABLET | Freq: Four times a day (QID) | ORAL | Status: DC
  Administered 2021-08-22 – 2021-08-23 (×3): 600 mg via ORAL
  Filled 2021-08-22 (×4): qty 1

## 2021-08-22 MED ORDER — SOD CITRATE-CITRIC ACID 500-334 MG/5ML PO SOLN: 30.0000 mL | ORAL | Status: DC | PRN

## 2021-08-22 MED ORDER — ZOLPIDEM TARTRATE 5 MG PO TABS: 5.0000 mg | ORAL_TABLET | Freq: Every evening | ORAL | Status: DC | PRN

## 2021-08-22 NOTE — Plan of Care (Signed)
  Problem: Education: Goal: Knowledge of Childbirth will improve Outcome: Progressing Goal: Ability to make informed decisions regarding treatment and plan of care will improve Outcome: Progressing Goal: Ability to state and carry out methods to decrease the pain will improve Outcome: Progressing   Problem: Coping: Goal: Ability to verbalize concerns and feelings about labor and delivery will improve Outcome: Progressing   

## 2021-08-22 NOTE — H&P (Signed)
Lori Moses is a 25 y.o. female, G2P1001, IUP at 39.3 weeks, presenting for spontaneous active labor at 6cm. H/O hyperprolactinemia, no meds, saw endo 2021, no f/u needed. Vit D def. Primary thrombocytopenia plat 143 at NOB, WNL on admission. PDA repair as infant. GBS neg, LR female. Pt endorse + Fm. Denies vaginal leakage. Denies vaginal bleeding.   Patient Active Problem List   Diagnosis Date Noted   Normal labor and delivery 08/22/2021   Postpartum anemia 10/20/2019   SVD (spontaneous vaginal delivery) 10/20/2019   Group beta Strep positive 10/18/2019   Urinary frequency 04/24/2018   Nausea and vomiting 03/13/2018   Acid reflux 03/13/2018   Encounter for planned induction of labor 03/13/2018   Pelvic pain 11/04/2017   Vaginal discharge 11/04/2017   Chlamydia 09/20/2017     Active Ambulatory Problems    Diagnosis Date Noted   Chlamydia 09/20/2017   Pelvic pain 11/04/2017   Vaginal discharge 11/04/2017   Nausea and vomiting 03/13/2018   Acid reflux 03/13/2018   Encounter for planned induction of labor 03/13/2018   Urinary frequency 04/24/2018   Group beta Strep positive 10/18/2019   Postpartum anemia 10/20/2019   SVD (spontaneous vaginal delivery) 10/20/2019   Resolved Ambulatory Problems    Diagnosis Date Noted   No Resolved Ambulatory Problems   Past Medical History:  Diagnosis Date   Anemia    H/O repair of patent ductus arteriosus    PID (acute pelvic inflammatory disease) 05/2017      Medications Prior to Admission  Medication Sig Dispense Refill Last Dose   Prenatal Vit-Fe Phos-FA-Omega (VITAFOL GUMMIES) 3.33-0.333-34.8 MG CHEW Chew 1 tablet by mouth daily. 90 tablet 5 08/22/2021    Past Medical History:  Diagnosis Date   Acid reflux    Anemia    H/O repair of patent ductus arteriosus    PID (acute pelvic inflammatory disease) 05/2017     No current facility-administered medications on file prior to encounter.   Current Outpatient Medications on  File Prior to Encounter  Medication Sig Dispense Refill   Prenatal Vit-Fe Phos-FA-Omega (VITAFOL GUMMIES) 3.33-0.333-34.8 MG CHEW Chew 1 tablet by mouth daily. 90 tablet 5     Allergies  Allergen Reactions   Flagyl [Metronidazole] Hives   Other Hives    potatoes   Penicillins Hives    Has patient had a PCN reaction causing immediate rash, facial/tongue/throat swelling, SOB or lightheadedness with hypotension: Yes Has patient had a PCN reaction causing severe rash involving mucus membranes or skin necrosis: No Has patient had a PCN reaction that required hospitalization No Has patient had a PCN reaction occurring within the last 10 years: No If all of the above answers are "NO", then may proceed with Cephalosporin use.     History of present pregnancy: Pt Info/Preference:  Screening/Consents:  Labs:   EDD: Estimated Date of Delivery: 08/26/21  Establised: Patient's last menstrual period was 11/17/2020.  Anatomy Scan: Date: 6/20 Placenta Location: anterior Genetic Screen: [place]:LR female AFP:  First Tri: Quad:  Office: [facility]            First PNV: 15 weeks Blood Type --/--/O POS (09/17 0050)  Language: english Last PNV: 38.5 weeks Rhogam    Flu Vaccine:  declined   Antibody NEG (09/17 0050)  TDaP vaccine UTD   GTT: Early: 5.3 Third Trimester: 90  Feeding Plan: Breast/bottle BTL: no Rubella:  Immune  Contraception: ??? VBAC: no RPR:   NR  Circumcision: N/a   HBsAg:  Neg  Pediatrician:  ???   HIV:   Neg  Prenatal Classes: no Additional [place]: no GBS:  Negative(For PCN allergy, check sensitivities)       Chlamydia: +2018    MFM Referral/Consult:  GC: neg  Support Person: mother   PAP: ???  Pain Management: none Neonatologist Referral:  Hgb Electrophoresis:  AA  Birth Plan: natural   Hgb NOB: 11.9    28W: 11.1   OB History     Gravida  2   Para  1   Term  1   Preterm  0   AB  0   Living  1      SAB  0   IAB  0   Ectopic  0   Multiple      Live Births  1           Past Medical History:  Diagnosis Date   Acid reflux    Anemia    H/O repair of patent ductus arteriosus    PID (acute pelvic inflammatory disease) 05/2017   Past Surgical History:  Procedure Laterality Date   PATENT DUCTUS ARTERIOUS REPAIR     as an infant   Family History: family history includes Arthritis in her maternal grandmother and mother; Breast cancer in her paternal grandmother; Diabetes in her maternal uncle; Down syndrome in her sister. Social History:  reports that she has never smoked. She has never used smokeless tobacco. She reports that she does not drink alcohol and does not use drugs.   Prenatal Transfer Tool  Maternal Diabetes: No Genetic Screening: Normal Maternal Ultrasounds/Referrals: Normal Fetal Ultrasounds or other Referrals:  None Maternal Substance Abuse:  No Significant Maternal Medications:  None Significant Maternal Lab Results: Group B Strep negative  ROS:  Review of Systems  Constitutional: Negative.   HENT: Negative.    Eyes: Negative.   Respiratory: Negative.    Cardiovascular: Negative.   Gastrointestinal:  Positive for abdominal pain.  Musculoskeletal: Negative.   Skin: Negative.   Neurological: Negative.   Endo/Heme/Allergies: Negative.   Psychiatric/Behavioral: Negative.      Physical Exam: BP 132/82   Pulse 91   Temp 98 F (36.7 C) (Oral)   Resp 18   Ht 5\' 7"  (1.702 m)   Wt 84.8 kg   LMP 11/17/2020   BMI 29.29 kg/m   Physical Exam Vitals and nursing note reviewed.  Constitutional:      Appearance: Normal appearance.  HENT:     Head: Normocephalic and atraumatic.     Nose: Nose normal.     Mouth/Throat:     Mouth: Mucous membranes are moist.  Eyes:     Pupils: Pupils are equal, round, and reactive to light.  Cardiovascular:     Rate and Rhythm: Normal rate and regular rhythm.     Pulses: Normal pulses.     Heart sounds: Normal heart sounds.  Pulmonary:     Effort: Pulmonary effort is normal.     Breath  sounds: Normal breath sounds.  Abdominal:     General: Bowel sounds are normal.  Musculoskeletal:        General: Normal range of motion.     Cervical back: Normal range of motion and neck supple.  Skin:    General: Skin is warm.     Capillary Refill: Capillary refill takes less than 2 seconds.  Neurological:     General: No focal deficit present.     Mental Status: She is alert.  Psychiatric:  Mood and Affect: Mood normal.     NST: FHR baseline 140 bpm, Variability: moderate, Accelerations:present, Decelerations:  Absent= Cat 1/Reactive UC:   irregular, every 5-8 minutes SVE:   Dilation: 5 Effacement (%): 80 Station: -2 Exam by:: benji stanleyRN, vertex verified by fetal sutures.  Leopold's: Position vertex, EFW 7.5lbs via leopold's.   Labs: Results for orders placed or performed during the hospital encounter of 08/21/21 (from the past 24 hour(s))  Resp Panel by RT-PCR (Flu A&B, Covid) Nasopharyngeal Swab     Status: None   Collection Time: 08/22/21 12:35 AM   Specimen: Nasopharyngeal Swab; Nasopharyngeal(NP) swabs in vial transport medium  Result Value Ref Range   SARS Coronavirus 2 by RT PCR NEGATIVE NEGATIVE   Influenza A by PCR NEGATIVE NEGATIVE   Influenza B by PCR NEGATIVE NEGATIVE  CBC     Status: Abnormal   Collection Time: 08/22/21 12:50 AM  Result Value Ref Range   WBC 6.6 4.0 - 10.5 K/uL   RBC 4.14 3.87 - 5.11 MIL/uL   Hemoglobin 10.7 (L) 12.0 - 15.0 g/dL   HCT 05.3 (L) 97.6 - 73.4 %   MCV 81.9 80.0 - 100.0 fL   MCH 25.8 (L) 26.0 - 34.0 pg   MCHC 31.6 30.0 - 36.0 g/dL   RDW 19.3 79.0 - 24.0 %   Platelets 121 (L) 150 - 400 K/uL   nRBC 0.0 0.0 - 0.2 %  Type and screen [facility]     Status: None   Collection Time: 08/22/21 12:50 AM  Result Value Ref Range   ABO/RH(D) O POS    Antibody Screen NEG    Sample Expiration      08/25/2021,2359 Performed at [facility], [address], [place], [place] [address]     Imaging:   No results found.  MAU Course: Orders Placed This Encounter  Procedures   Resp Panel by RT-PCR (Flu A&B, Covid) Nasopharyngeal Swab   CBC   RPR   Diet clear liquid Room service appropriate? Yes; Fluid consistency: Thin   Vitals signs per unit policy   Notify physician (specify)   Fetal monitoring per unit policy   Activity as tolerated   Cervical Exam   Measure blood pressure post delivery every 15 min x 1 hour then every 30 min x 1 hour   Fundal check post delivery every 15 min x 1 hour then every 30 min x 1 hour   If Rapid HIV test positive or known HIV positive: initiate AZT orders   May in and out cath x 2 for inability to void   Discontinue foley prior to vaginal delivery   Initiate Carrier Fluid Protocol   Initiate Oral Care Protocol   Order Rapid HIV per protocol if no results on chart   Patient may have epidural placement upon request   Full code   Type and screen [facility]   Insert and maintain IV Line   Admit to Inpatient (patient's expected length of stay will be greater than 2 midnights or inpatient only procedure)   Meds ordered this encounter  Medications   lactated ringers infusion   oxytocin (PITOCIN) IV BOLUS FROM BAG   oxytocin (PITOCIN) IV infusion 30 units in NS 500 mL - Premix   lactated ringers infusion 500-1,000 mL   acetaminophen (TYLENOL) tablet 650 mg   oxyCODONE-acetaminophen (PERCOCET/ROXICET) 5-325 MG per tablet 1 tablet   oxyCODONE-acetaminophen (PERCOCET/ROXICET) 5-325 MG per tablet 2 tablet  ondansetron (ZOFRAN) injection 4 mg   sodium citrate-citric acid (ORACIT) solution 30 mL   lidocaine (PF) (XYLOCAINE) 1 % injection 30 mL   fentaNYL (SUBLIMAZE) injection 50-100 mcg    Assessment/Plan: VERDIS BASSETTE is a 25 y.o. female, G2P1001, IUP at 39.3 weeks, presenting for spontaneous active labor at 6cm. H/O hyperprolactinemia, no meds, saw endo 2021, no f/u needed. Vit D def. Primary thrombocytopenia plat 143 at NOB, WNL  on admission. PDA repair as infant. GBS neg, LR female. Pt endorse + Fm. Denies vaginal leakage. Denies vaginal bleeding.  FWB: Cat 1 Fetal Tracing.   Plan: Admit to Birthing Suite Routine [facility] orders Pain med/epidural prn Anticipate labor progression   Dale St. Thomas NP-C, CNM, MSN 08/22/2021, 2:55 AM

## 2021-08-22 NOTE — MAU Note (Signed)
Was seen MAU last night with ctxs and was 3cm. Have had ctxs all day but stronger tonight. Denies LOF or VB

## 2021-08-22 NOTE — Lactation Note (Signed)
This note was copied from a baby's chart. Lactation Consultation Note  Patient Name: Lori Moses [id number] Date: 08/22/2021 Reason for consult: L&D Initial assessment;Term Age:23 hours   Initial L&D Consult:  Visited with family < 1 hour after birth Baby fussy and STS on mother's chest when I arrived.  Offered to assist with latching and mother stated, "I don't wanna latch."  Mother's feeding preference is breast/formula.  Offered to follow up on the M/B unit and mother is not interested at this time.  She will call as needed for lactation assistance.  Grandmother in the room and asked about renting a DEBP.  Informed her that we do have pumps available for rent in the gift shop or that mother can begin pumping with the DEBP on the M/B unit if desired.  Allowed time for family bonding.   Maternal Data    Feeding Mother's Current Feeding Choice: Breast Milk and Formula  LATCH Score                    Lactation Tools Discussed/Used    Interventions    Discharge    Consult Status Consult Status: PRN    [name] R Shira Bobst 08/22/2021, 3:25 AM

## 2021-08-23 MED ORDER — IBUPROFEN 600 MG PO TABS: 600.0000 mg | ORAL_TABLET | Freq: Four times a day (QID) | ORAL | 0 refills | Status: DC

## 2021-08-23 MED ORDER — ACETAMINOPHEN 325 MG PO TABS: 650.0000 mg | ORAL_TABLET | ORAL | Status: DC | PRN

## 2021-08-23 NOTE — Discharge Summary (Signed)
SVD OB Discharge Summary  Patient Name: Lori Moses DOB: Jan 31, 1996 MRN: [id number]  Date of admission: 08/21/2021 Intrauterine pregnancy: [redacted]w[redacted]d   Admitting diagnosis: Normal labor and delivery [O80] Secondary diagnosis: None  Date of discharge: 08/23/2021    Discharge diagnosis: Term Pregnancy Delivered     Prenatal history: [id number]   EDC : 08/26/2021, by Ultrasound  Pren[facility] care at [facility]  Primary provider : [facility] Prenatal course complicated by thrombocytopenia. H/O hyperprolactinemia, PDA repair as an infant.  Prenatal Labs: ABO, Rh: --/--/O POS (09/17 0050) /  Antibody: NEG (09/17 0050) Rubella:    Immune RPR: NON REACTIVE (09/17 0050)  HBsAg:   Negative HIV:   Negative GBS:   Negative                                   Hospital course:  Onset of Labor With Vaginal Delivery      25 y.o. yo [id number] at [redacted]w[redacted]d was admitted in Active Labor on 08/21/2021. Patient had an uncomplicated labor course as follows:  Membrane Rupture Time/Date: 2:29 AM ,08/22/2021   Delivery Method:Vaginal, Spontaneous  Episiotomy: None  Lacerations:  1st degree;Perineal  Patient had an uncomplicated postpartum course.  She is ambulating, tolerating a regular diet, passing flatus, and urinating well. Patient is discharged home in stable condition on 08/23/21.  Newborn Data: Birth date:08/22/2021  Birth time:2:29 AM  Gender:Female  Living status:Living  Apgars:9 ,9  Weight:3515 g  Delivering PROVIDER: [place], JADE                                                            Complications: None  Newborn Data: Live born female  Birth Weight: 7 lb 12 oz (3515 g) APGAR: 9, 9  Newborn Delivery   Birth date/time: 08/22/2021 02:29:00 Delivery type: Vaginal, Spontaneous      Baby Feeding: Bottle and Breast Disposition:home with mother  Post partum procedures: N/A  Labs: Lab Results  Component Value Date   WBC 9.6 08/22/2021   HGB 11.0 (L) 08/22/2021   HCT 33.4 (L) 08/22/2021   MCV  80.3 08/22/2021   PLT 125 (L) 08/22/2021   CMP Latest Ref Rng & Units 01/01/2021  Glucose 70 - 99 mg/dL 86  BUN 6 - 20 mg/dL 6  Creatinine 1.76 - 1.60 mg/dL 7.37  Sodium 106 - 269 mmol/L 136  Potassium 3.5 - 5.1 mmol/L 4.3  Chloride 98 - 111 mmol/L 105  CO2 22 - 32 mmol/L 24  Calcium 8.9 - 10.3 mg/dL 9.1  Total Protein 6.5 - 8.1 g/dL 6.7  Total Bilirubin 0.3 - 1.2 mg/dL 0.7  Alkaline Phos 38 - 126 U/L 52  AST 15 - 41 U/L 14(L)  ALT 0 - 44 U/L 10    Physical Exam @ time of discharge:  Vitals:   08/22/21 0905 08/22/21 1300 08/22/21 2115 08/23/21 0554  BP: 114/79 110/76 117/81 113/78  Pulse: 73 73 78 74  Resp: 20 17 18 18   Temp: 98.3 F (36.8 C) 98 F (36.7 C) 98.4 F (36.9 C) 98 F (36.7 C)  TempSrc: Oral Oral Oral Oral  SpO2: 100% 99%    Weight:      Height:  general: alert, cooperative, and no distress lochia: appropriate uterine fundus: firm perineum: 1st degree well approximated incision: N/A extremities: DVT Evaluation: No evidence of DVT seen on physical exam. Negative Homan's sign. No cords or calf tenderness.  Discharge instructions:  "Baby and Me Booklet" and [facility] Booklet Discharge Medications:  Allergies as of 08/23/2021       Reactions   Flagyl [metronidazole] Hives   Other Hives   potatoes   Penicillins Hives   Has patient had a PCN reaction causing immediate rash, facial/tongue/throat swelling, SOB or lightheadedness with hypotension: Yes Has patient had a PCN reaction causing severe rash involving mucus membranes or skin necrosis: No Has patient had a PCN reaction that required hospitalization No Has patient had a PCN reaction occurring within the last 10 years: No If all of the above answers are "NO", then may proceed with Cephalosporin use.        Medication List     TAKE these medications    acetaminophen 325 MG tablet Commonly known as: Tylenol Take 2 tablets (650 mg total) by mouth every 4 (four) hours as needed (for pain  scale < 4).   ibuprofen 600 MG tablet Commonly known as: ADVIL Take 1 tablet (600 mg total) by mouth every 6 (six) hours.   Vitafol Gummies 3.33-0.333-34.8 MG Chew Chew 1 tablet by mouth daily.       Diet: routine diet Activity: Advance as tolerated. Pelvic rest x 6 weeks.  Follow up:6 weeks  Signed: Carollee Leitz MSN, CNM 08/23/2021, 10:53 AM

## 2021-08-24 ENCOUNTER — Telehealth: Payer: Self-pay

## 2021-08-24 NOTE — Telephone Encounter (Signed)
Transition Care Management Unsuccessful Follow-up Telephone Call  Date of discharge and from where:  08/23/2021-[facility]  Attempts:  1st Attempt  Reason for unsuccessful TCM follow-up call:  Voice mail full

## 2021-08-25 NOTE — Telephone Encounter (Signed)
Transition Care Management Unsuccessful Follow-up Telephone Call  Date of discharge and from where:  08/23/2021-[facility]  Attempts:  2nd Attempt  Reason for unsuccessful TCM follow-up call:  Voice mail full

## 2021-08-27 NOTE — Telephone Encounter (Signed)
Transition Care Management Unsuccessful Follow-up Telephone Call  Date of discharge and from where:  08/23/2021 from [facility]  Attempts:  3rd Attempt  Reason for unsuccessful TCM follow-up call:  Unable to reach patient

## 2021-09-02 ENCOUNTER — Other Ambulatory Visit: Payer: Self-pay

## 2021-09-02 ENCOUNTER — Encounter (HOSPITAL_COMMUNITY): Payer: Self-pay | Admitting: Obstetrics & Gynecology

## 2021-09-02 ENCOUNTER — Inpatient Hospital Stay (HOSPITAL_COMMUNITY)
Admission: AD | Admit: 2021-09-02 | Discharge: 2021-09-02 | Disposition: A | Discharge status: Home / Self Care | Payer: Medicaid Other | Attending: Obstetrics & Gynecology | Admitting: Obstetrics & Gynecology

## 2021-09-02 DIAGNOSIS — O99893 Other specified diseases and conditions complicating puerperium: Secondary | ICD-10-CM | POA: Diagnosis not present

## 2021-09-02 DIAGNOSIS — G8929 Other chronic pain: Secondary | ICD-10-CM

## 2021-09-02 DIAGNOSIS — Z880 Allergy status to penicillin: Secondary | ICD-10-CM | POA: Diagnosis not present

## 2021-09-02 DIAGNOSIS — M5442 Lumbago with sciatica, left side: Secondary | ICD-10-CM

## 2021-09-02 DIAGNOSIS — N939 Abnormal uterine and vaginal bleeding, unspecified: Secondary | ICD-10-CM | POA: Diagnosis not present

## 2021-09-02 DIAGNOSIS — M5432 Sciatica, left side: Secondary | ICD-10-CM | POA: Diagnosis not present

## 2021-09-02 DIAGNOSIS — M5417 Radiculopathy, lumbosacral region: Secondary | ICD-10-CM | POA: Insufficient documentation

## 2021-09-02 MED ORDER — NAPROXEN 500 MG PO TABS: 500.0000 mg | ORAL_TABLET | Freq: Two times a day (BID) | ORAL | 0 refills | Status: AC | PRN

## 2021-09-02 NOTE — MAU Note (Addendum)
Vag delivery on 9/17.  Ever since then has been passing big blood clots, fist sized.   Bleeding otherwise is light. Denies dizziness. Having pain in left leg, upper, outer thigh, through out the preganancy.   Was having SOB on Monday, no longer.

## 2021-09-02 NOTE — Discharge Instructions (Signed)
You came to the MAU because you passed clots. We looked at your bleeding today and it was not concerning since your last clots was on Monday. You had no symptoms of anemia  You also continue to have left upper leg pain similar to what you have had for the last few years. We think you have sciatica and will refer you to primary care as well as physical therapy. Please try to do the home exercises we printed for you. And we also sent some Naproxen for use as needed. Please take it with food if you need it.
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
## 2021-09-04 ENCOUNTER — Telehealth (HOSPITAL_COMMUNITY): Payer: Self-pay | Admitting: *Deleted

## 2021-09-04 NOTE — Telephone Encounter (Signed)
Voice mailbox full, no message left.  Duffy Rhody, RN 09-04-2021 at 12:04pm

## 2021-10-05 DIAGNOSIS — R110 Nausea: Secondary | ICD-10-CM | POA: Diagnosis not present

## 2021-10-05 DIAGNOSIS — Z3040 Encounter for surveillance of contraceptives, unspecified: Secondary | ICD-10-CM | POA: Diagnosis not present

## 2021-10-05 DIAGNOSIS — Z7251 High risk heterosexual behavior: Secondary | ICD-10-CM | POA: Diagnosis not present

## 2021-10-05 DIAGNOSIS — R112 Nausea with vomiting, unspecified: Secondary | ICD-10-CM | POA: Diagnosis not present

## 2022-02-15 DIAGNOSIS — Z3202 Encounter for pregnancy test, result negative: Secondary | ICD-10-CM | POA: Diagnosis not present

## 2022-02-15 DIAGNOSIS — R350 Frequency of micturition: Secondary | ICD-10-CM | POA: Diagnosis not present

## 2022-02-15 DIAGNOSIS — N898 Other specified noninflammatory disorders of vagina: Secondary | ICD-10-CM | POA: Diagnosis not present

## 2022-02-15 DIAGNOSIS — R1084 Generalized abdominal pain: Secondary | ICD-10-CM | POA: Diagnosis not present

## 2022-04-01 DIAGNOSIS — N898 Other specified noninflammatory disorders of vagina: Secondary | ICD-10-CM | POA: Diagnosis not present

## 2022-04-01 DIAGNOSIS — Z113 Encounter for screening for infections with a predominantly sexual mode of transmission: Secondary | ICD-10-CM | POA: Diagnosis not present

## 2022-05-10 ENCOUNTER — Emergency Department (HOSPITAL_COMMUNITY)
Admission: EM | Admit: 2022-05-10 | Discharge: 2022-05-10 | Disposition: A | Discharge status: Home / Self Care | Payer: Medicaid Other | Attending: Emergency Medicine | Admitting: Emergency Medicine

## 2022-05-10 ENCOUNTER — Encounter (HOSPITAL_COMMUNITY): Payer: Self-pay | Admitting: Emergency Medicine

## 2022-05-10 DIAGNOSIS — S6992XA Unspecified injury of left wrist, hand and finger(s), initial encounter: Secondary | ICD-10-CM | POA: Diagnosis present

## 2022-05-10 DIAGNOSIS — Y92410 Unspecified street and highway as the place of occurrence of the external cause: Secondary | ICD-10-CM | POA: Diagnosis not present

## 2022-05-10 DIAGNOSIS — S63502A Unspecified sprain of left wrist, initial encounter: Secondary | ICD-10-CM | POA: Diagnosis not present
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
## 2022-05-10 NOTE — Discharge Instructions (Signed)
Return if any problems.
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
## 2022-05-11 ENCOUNTER — Telehealth: Payer: Self-pay

## 2022-05-11 NOTE — Telephone Encounter (Signed)
Transition Care Management Follow-up Telephone Call Date of discharge and from where: 05/10/2022 from [facility] How have you been since you were released from the hospital? Patient stated that she is feeling about the same. Patient did not have any questions or concerns at this time.  Any questions or concerns? No  Items Reviewed: Did the pt receive and understand the discharge instructions provided? Yes  Medications obtained and verified? Yes  Other? No  Any new allergies since your discharge? No  Dietary orders reviewed? No Do you have support at home? Yes   Functional Questionnaire: (I = Independent and D = Dependent) ADLs: I  Bathing/Dressing- I  Meal Prep- I  Eating- I  Maintaining continence- I  Transferring/Ambulation- I  Managing Meds- I   Follow up appointments reviewed:  PCP Hospital f/u appt confirmed? Yes Gwinda Passe, NP on 06/29/2022 at [facility] f/u appt confirmed? No   Are transportation arrangements needed? No  If their condition worsens, is the pt aware to call PCP or go to the Emergency Dept.? Yes Was the patient provided with contact information for the PCP's office or ED? Yes Was to pt encouraged to call back with questions or concerns? Yes

## 2022-05-31 ENCOUNTER — Ambulatory Visit
Admission: EM | Admit: 2022-05-31 | Discharge: 2022-05-31 | Disposition: A | Discharge status: Home / Self Care | Payer: Medicaid Other | Attending: Internal Medicine | Admitting: Internal Medicine

## 2022-05-31 DIAGNOSIS — Z113 Encounter for screening for infections with a predominantly sexual mode of transmission: Secondary | ICD-10-CM | POA: Insufficient documentation

## 2022-05-31 DIAGNOSIS — N898 Other specified noninflammatory disorders of vagina: Secondary | ICD-10-CM | POA: Diagnosis not present

## 2022-05-31 NOTE — ED Triage Notes (Signed)
 Patient presents to Urgent Care with complaints of vaginal discomfort and itchiness since this morning. Patient reports no other symptoms such as dysuria, abdominal pain.

## 2022-06-01 LAB — CERVICOVAGINAL ANCILLARY ONLY
Bacterial Vaginitis (gardnerella): POSITIVE — AB
Candida Glabrata: NEGATIVE
Candida Vaginitis: POSITIVE — AB
Chlamydia: NEGATIVE
Comment: NEGATIVE
Comment: NEGATIVE
Comment: NEGATIVE
Comment: NEGATIVE
Comment: NEGATIVE
Comment: NORMAL
Neisseria Gonorrhea: NEGATIVE
Trichomonas: NEGATIVE

## 2022-06-02 ENCOUNTER — Telehealth (HOSPITAL_COMMUNITY): Payer: Self-pay | Admitting: Emergency Medicine

## 2022-06-02 MED ORDER — FLUCONAZOLE 150 MG PO TABS: 150.0000 mg | ORAL_TABLET | Freq: Once | ORAL | 0 refills | Status: AC

## 2022-06-02 MED ORDER — CLINDAMYCIN HCL 150 MG PO CAPS: 300.0000 mg | ORAL_CAPSULE | Freq: Two times a day (BID) | ORAL | 0 refills | Status: AC

## 2022-06-29 ENCOUNTER — Ambulatory Visit (INDEPENDENT_AMBULATORY_CARE_PROVIDER_SITE_OTHER): Payer: Medicaid Other | Admitting: Primary Care

## 2022-07-03 ENCOUNTER — Encounter: Payer: Self-pay | Admitting: Emergency Medicine

## 2022-07-03 ENCOUNTER — Ambulatory Visit
Admission: EM | Admit: 2022-07-03 | Discharge: 2022-07-03 | Disposition: A | Discharge status: Home / Self Care | Payer: Medicaid Other | Attending: Family Medicine | Admitting: Family Medicine

## 2022-07-03 DIAGNOSIS — R300 Dysuria: Secondary | ICD-10-CM

## 2022-07-03 DIAGNOSIS — N3090 Cystitis, unspecified without hematuria: Secondary | ICD-10-CM

## 2022-07-03 LAB — POCT URINALYSIS DIP (MANUAL ENTRY)
Bilirubin, UA: NEGATIVE
Glucose, UA: NEGATIVE mg/dL
Ketones, POC UA: NEGATIVE mg/dL
Nitrite, UA: POSITIVE — AB
Protein Ur, POC: 100 mg/dL — AB
Spec Grav, UA: >=1.03 — AB (ref 1.010–1.025)
Urobilinogen, UA: 0.2 E.U./dL
pH, UA: 6 (ref 5.0–8.0)

## 2022-07-03 LAB — POCT URINE PREGNANCY: Preg Test, Ur: NEGATIVE

## 2022-07-03 MED ORDER — SULFAMETHOXAZOLE-TRIMETHOPRIM 800-160 MG PO TABS: 1.0000 | ORAL_TABLET | Freq: Two times a day (BID) | ORAL | 0 refills | Status: AC

## 2022-07-03 NOTE — Discharge Instructions (Signed)
In addition to a urine culture, we have sent testing for sexually transmitted infections. We will notify you of any positive results once they are received. If required, we will prescribe any medications you might need.  

## 2022-07-03 NOTE — ED Triage Notes (Signed)
Pt is present today with c/o dysuria, vaginal discharge, and concerns for STD and pregnancy

## 2022-07-05 LAB — URINE CULTURE: Culture: >=100000 — AB

## 2022-07-05 LAB — CERVICOVAGINAL ANCILLARY ONLY
Bacterial Vaginitis (gardnerella): POSITIVE — AB
Candida Glabrata: NEGATIVE
Candida Vaginitis: NEGATIVE
Chlamydia: NEGATIVE
Comment: NEGATIVE
Comment: NEGATIVE
Comment: NEGATIVE
Comment: NEGATIVE
Comment: NEGATIVE
Comment: NORMAL
Neisseria Gonorrhea: NEGATIVE
Trichomonas: NEGATIVE

## 2022-07-05 NOTE — ED Provider Notes (Signed)
[facility] CARE [facility]ENTER   [id number] 07/03/22 Arrival Time: 1414  ASSESSMENT & PLAN:  1. Dysuria   2. Cystitis    Begin: Meds ordered this encounter  Medications   sulfamethoxazole-trimethoprim (BACTRIM DS) 800-160 MG tablet    Sig: Take 1 tablet by mouth 2 (two) times daily for 5 days.    Dispense:  10 tablet    Refill:  0   Vaginal cytology pending.    Discharge Instructions      In addition to a urine culture, we have sent testing for sexually transmitted infections. We will notify you of any positive results once they are received. If required, we will prescribe any medications you might need.     Without s/s of PID.  Labs Reviewed  URINE CULTURE - Abnormal; Notable for the following components:      Result Value   Culture >=100,000 COLONIES/mL ESCHERICHIA COLI (*)    Organism ID, Bacteria ESCHERICHIA COLI (*)    All other components within normal limits  POCT URINALYSIS DIP (MANUAL ENTRY) - Abnormal; Notable for the following components:   Clarity, UA cloudy (*)    Spec Grav, UA >=1.030 (*)    Blood, UA large (*)    Protein Ur, POC =100 (*)    Nitrite, UA Positive (*)    Leukocytes, UA Moderate (2+) (*)    All other components within normal limits  POCT URINE PREGNANCY  CERVICOVAGINAL ANCILLARY ONLY   Will notify of any positive results. Instructed to refrain from sexual activity for at least seven days.  Reviewed expectations re: course of current medical issues. Questions answered. Outlined signs and symptoms indicating need for more acute intervention. Patient verbalized understanding. After Visit Summary given.   SUBJECTIVE:  Lori Moses is a 26 y.o. female who presents today with c/o dysuria, vaginal discharge, and concerns for STD and pregnancy. No abd/pelvic pain. Afebrile. Is sexually active.  Patient's last menstrual period was 06/21/2022.   OBJECTIVE:  Vitals:   07/03/22 1449  BP: 107/72  Pulse: 86  Resp: 17  Temp: 97.8 F (36.6  C)  SpO2: 98%     General appearance: alert, cooperative, appears stated age and no distress Lungs: unlabored respirations; speaks full sentences without difficulty Back: no CVA tenderness; FROM at waist Abdomen: soft, non-tender GU: deferred Skin: warm and dry Psychological: alert and cooperative; normal mood and affect.  Results for orders placed or performed during the hospital encounter of 07/03/22  Urine Culture   Specimen: Urine, Clean Catch  Result Value Ref Range   Specimen Description URINE, CLEAN CATCH    Special Requests      NONE Performed at [facility], [address] [place], [place] [address]    Culture >=100,000 COLONIES/mL ESCHERICHIA COLI (A)    Report Status 07/05/2022 FINAL    Organism ID, Bacteria ESCHERICHIA COLI (A)       Susceptibility   Escherichia coli - MIC*    AMPICILLIN 8 SENSITIVE Sensitive     CEFAZOLIN <=4 SENSITIVE Sensitive     CEFEPIME <=0.12 SENSITIVE Sensitive     CEFTRIAXONE <=0.25 SENSITIVE Sensitive     CIPROFLOXACIN <=0.25 SENSITIVE Sensitive     GENTAMICIN <=1 SENSITIVE Sensitive     IMIPENEM <=0.25 SENSITIVE Sensitive     NITROFURANTOIN <=16 SENSITIVE Sensitive     TRIMETH/SULFA <=20 SENSITIVE Sensitive     AMPICILLIN/SULBACTAM <=2 SENSITIVE Sensitive     PIP/TAZO <=4 SENSITIVE Sensitive     * >=100,000 COLONIES/mL ESCHERICHIA COLI  POCT urinalysis dipstick  Result Value Ref Range   Color, UA yellow yellow   Clarity, UA cloudy (A) clear   Glucose, UA negative negative mg/dL   Bilirubin, UA negative negative   Ketones, POC UA negative negative mg/dL   Spec Grav, UA >=6.144 (A) 1.010 - 1.025   Blood, UA large (A) negative   pH, UA 6.0 5.0 - 8.0   Protein Ur, POC =100 (A) negative mg/dL   Urobilinogen, UA 0.2 0.2 or 1.0 E.U./dL   Nitrite, UA Positive (A) Negative   Leukocytes, UA Moderate (2+) (A) Negative  POCT urine pregnancy  Result Value Ref Range   Preg Test, Ur Negative Negative    Labs Reviewed  URINE  CULTURE - Abnormal; Notable for the following components:      Result Value   Culture >=100,000 COLONIES/mL ESCHERICHIA COLI (*)    Organism ID, Bacteria ESCHERICHIA COLI (*)    All other components within normal limits  POCT URINALYSIS DIP (MANUAL ENTRY) - Abnormal; Notable for the following components:   Clarity, UA cloudy (*)    Spec Grav, UA >=1.030 (*)    Blood, UA large (*)    Protein Ur, POC =100 (*)    Nitrite, UA Positive (*)    Leukocytes, UA Moderate (2+) (*)    All other components within normal limits  POCT URINE PREGNANCY  CERVICOVAGINAL ANCILLARY ONLY    Allergies  Allergen Reactions   Flagyl [Metronidazole] Hives   Other Hives    potatoes   Penicillins Hives    Has patient had a PCN reaction causing immediate rash, facial/tongue/throat swelling, SOB or lightheadedness with hypotension: Yes Has patient had a PCN reaction causing severe rash involving mucus membranes or skin necrosis: No Has patient had a PCN reaction that required hospitalization No Has patient had a PCN reaction occurring within the last 10 years: No If all of the above answers are "NO", then may proceed with Cephalosporin use.     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 Goal to open up own daycare.   Social Determinants of Health   Financial Resource Strain: Low Risk  (11/14/2018)   Overall Financial Resource Strain (CARDIA)  Difficulty of Paying Living Expenses: Not very hard  Food Insecurity: No Food Insecurity (11/14/2018)   Hunger Vital Sign    Worried About Running Out of Food in the Last Year: Never true    Ran Out of Food in the Last Year: Never true  Transportation Needs: No Transportation Needs (11/14/2018)   PRAPARE - Administrator, Civil Service (Medical): No    Lack of Transportation (Non-Medical): No  Physical Activity: Unknown (01/17/2019)   Exercise Vital Sign    Days of Exercise per Week: Not on file    Minutes of Exercise per Session: 0 min  Stress: No Stress Concern Present (01/17/2019)   [employer] of Occupational Health - Occupational Stress Questionnaire    Feeling of Stress : Not at all  Social Connections: Unknown (01/17/2019)   Social Connection and Isolation Panel [NHANES]    Frequency of Communication with Friends and Family: More than three times a week    Frequency of Social Gatherings with Friends and Family: More than three times a week    Attends Religious Services: Never    Database administrator or Organizations: No    Attends Banker Meetings: Never    Marital Status: Not on file  Intimate Partner Violence: Not At Risk (11/14/2018)   Humiliation, Afraid, Rape, and Kick questionnaire    Fear of Current or Ex-Partner: No    Emotionally Abused: No    Physically Abused: No    Sexually Abused: No           Mardella Layman, MD 07/05/22 [phone]

## 2022-07-06 ENCOUNTER — Telehealth (HOSPITAL_COMMUNITY): Payer: Self-pay | Admitting: Emergency Medicine

## 2022-07-06 MED ORDER — CLINDAMYCIN HCL 150 MG PO CAPS: 300.0000 mg | ORAL_CAPSULE | Freq: Two times a day (BID) | ORAL | 0 refills | Status: AC

## 2022-08-16 DIAGNOSIS — J309 Allergic rhinitis, unspecified: Secondary | ICD-10-CM | POA: Diagnosis not present

## 2022-08-16 DIAGNOSIS — R0981 Nasal congestion: Secondary | ICD-10-CM | POA: Diagnosis not present

## 2022-08-18 DIAGNOSIS — Z20822 Contact with and (suspected) exposure to covid-19: Secondary | ICD-10-CM | POA: Diagnosis not present

## 2022-08-18 DIAGNOSIS — J069 Acute upper respiratory infection, unspecified: Secondary | ICD-10-CM | POA: Diagnosis not present

## 2022-09-03 DIAGNOSIS — Z113 Encounter for screening for infections with a predominantly sexual mode of transmission: Secondary | ICD-10-CM | POA: Diagnosis not present

## 2022-09-03 DIAGNOSIS — Z124 Encounter for screening for malignant neoplasm of cervix: Secondary | ICD-10-CM | POA: Diagnosis not present

## 2022-09-03 DIAGNOSIS — Z6822 Body mass index (BMI) 22.0-22.9, adult: Secondary | ICD-10-CM | POA: Diagnosis not present

## 2022-09-03 DIAGNOSIS — Z0000 Encounter for general adult medical examination without abnormal findings: Secondary | ICD-10-CM | POA: Diagnosis not present

## 2022-09-07 DIAGNOSIS — Z113 Encounter for screening for infections with a predominantly sexual mode of transmission: Secondary | ICD-10-CM | POA: Diagnosis not present

## 2022-12-27 DIAGNOSIS — Z3202 Encounter for pregnancy test, result negative: Secondary | ICD-10-CM | POA: Diagnosis not present

## 2022-12-27 DIAGNOSIS — N760 Acute vaginitis: Secondary | ICD-10-CM | POA: Diagnosis not present

## 2022-12-27 DIAGNOSIS — R5383 Other fatigue: Secondary | ICD-10-CM | POA: Diagnosis not present

## 2022-12-27 DIAGNOSIS — R82998 Other abnormal findings in urine: Secondary | ICD-10-CM | POA: Diagnosis not present

## 2023-01-08 IMAGING — US US OB < 14 WEEKS - US OB TV
1 series · 15 of 28 positions shown · non-contrast
Comparison: None for this gestation

CLINICAL DATA: Abdominal cramping in first trimester pregnancy, LMP
11/17/2020

EXAM:
OBSTETRIC <14 WK US AND TRANSVAGINAL OB US
TECHNIQUE: Both transabdominal and transvaginal ultrasound examinations were
performed for complete evaluation of the gestation as well as the
maternal uterus, adnexal regions, and pelvic cul-de-sac.
Transvaginal technique was performed to assess early pregnancy.

[Series 1: us ob < 14 weeks - us ob tv · 15 of 48 slices shown]
[im 1/48]
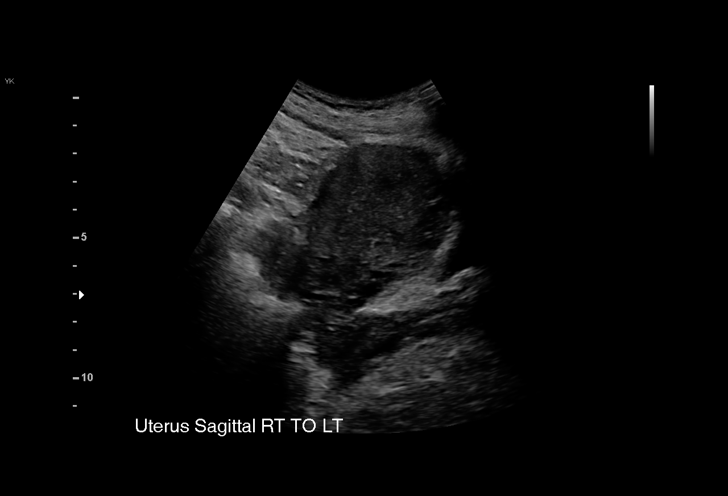
[im 4/48]
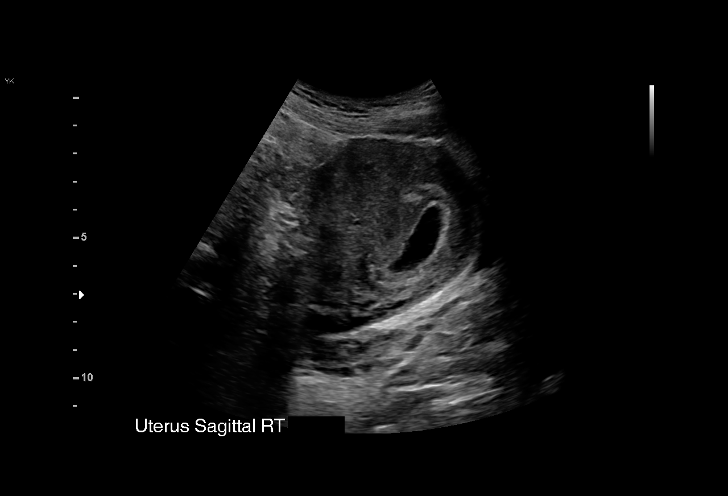
[im 7/48]
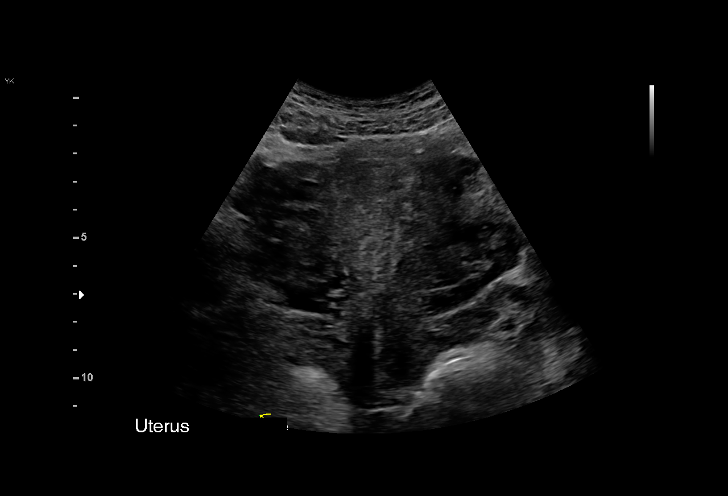
[im 11/48]
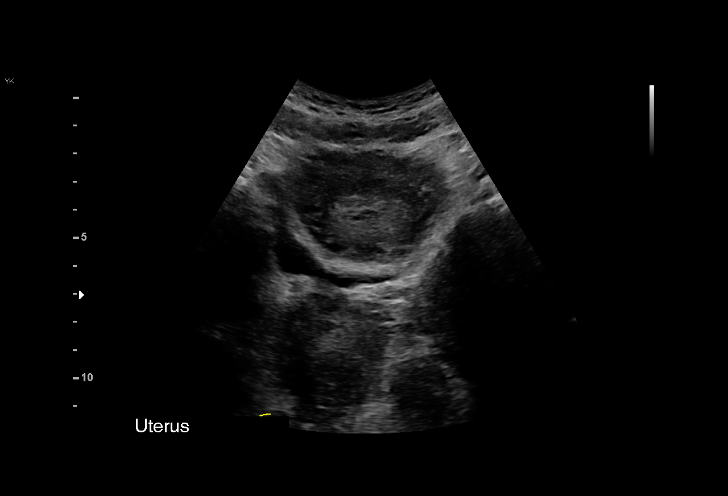
[im 14/48]
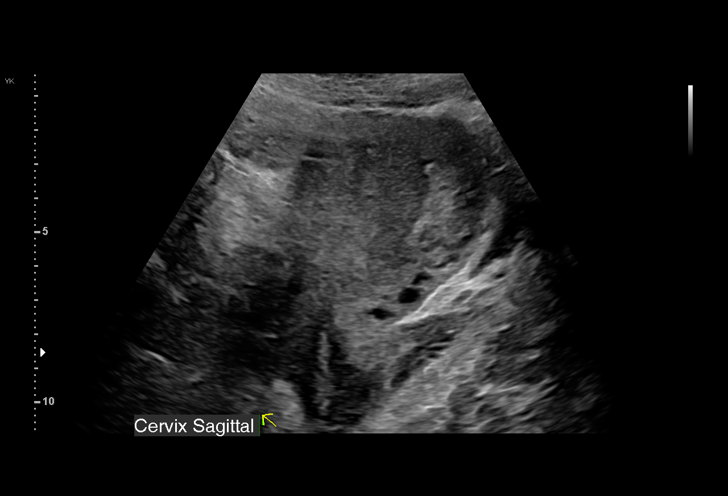
[im 18/48]
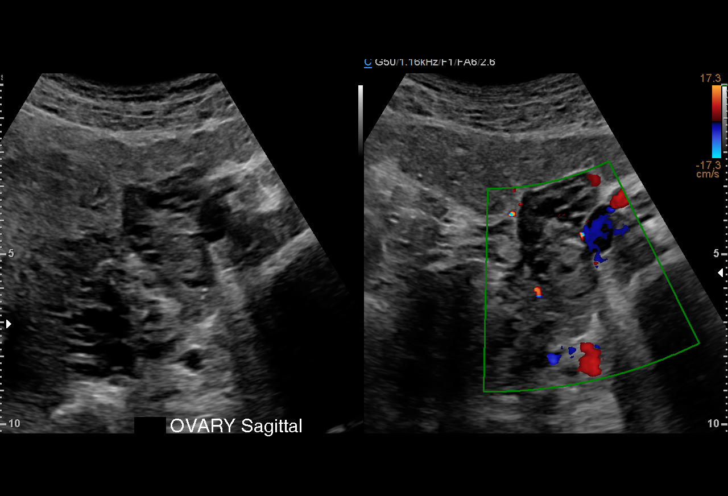
[im 21/48]
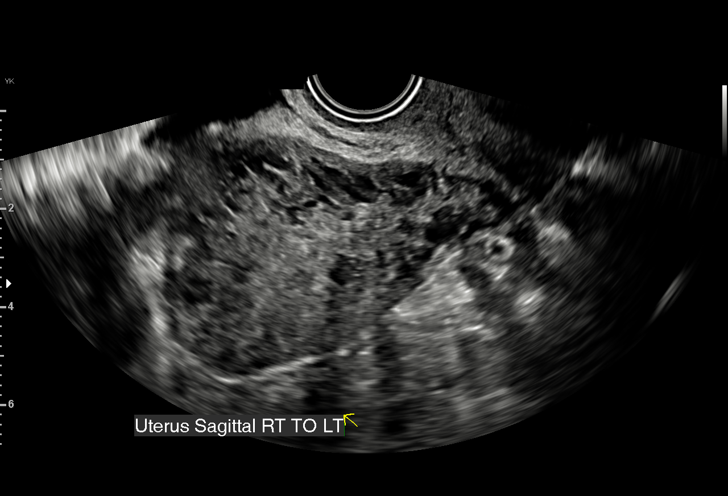
[im 25/48]
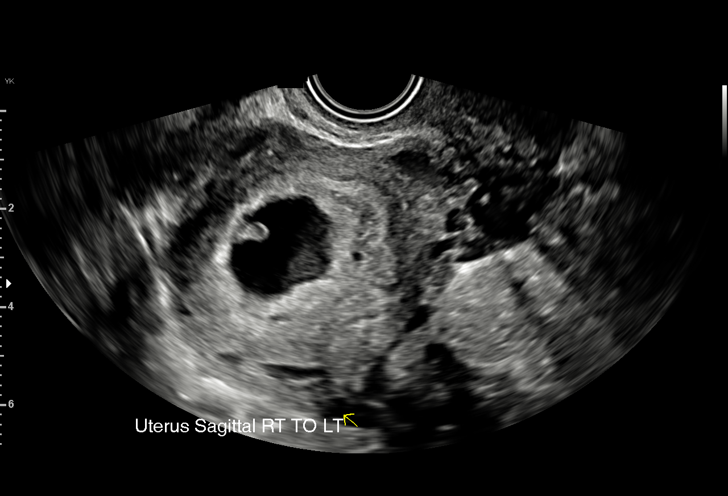
[im 27/48]
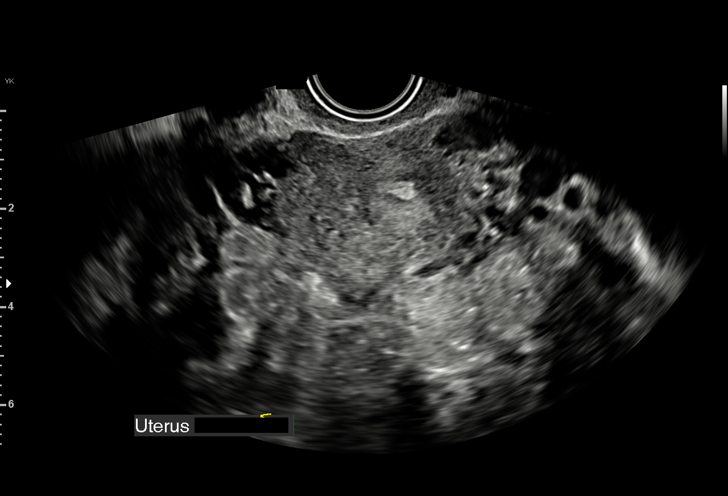
[im 30/48]
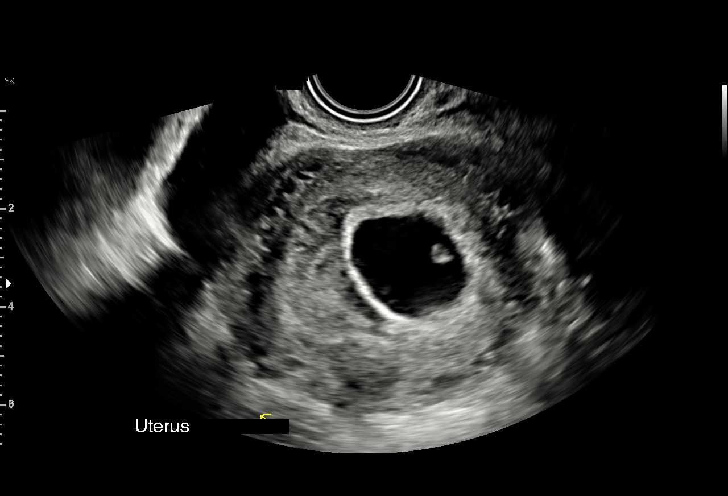
[im 34/48]
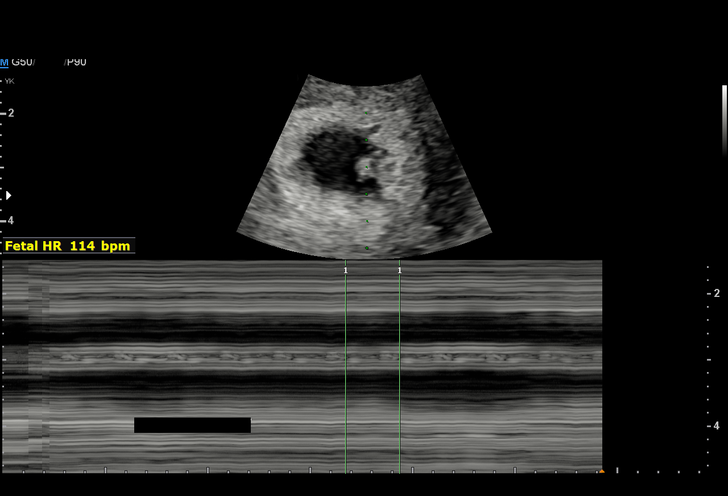
[im 37/48]
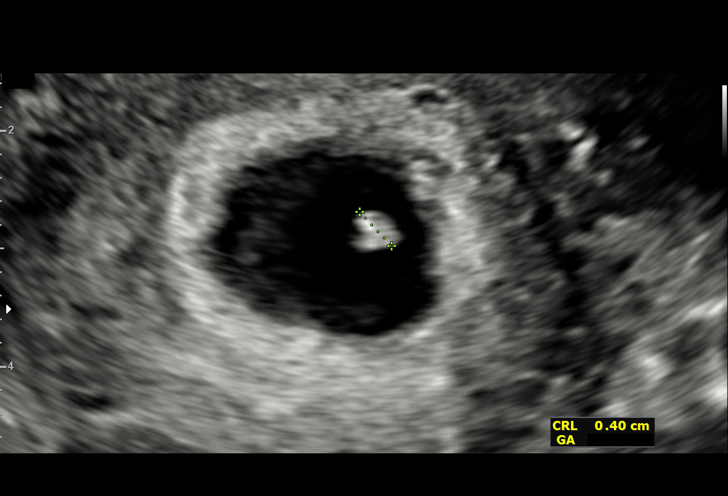
[im 41/48]
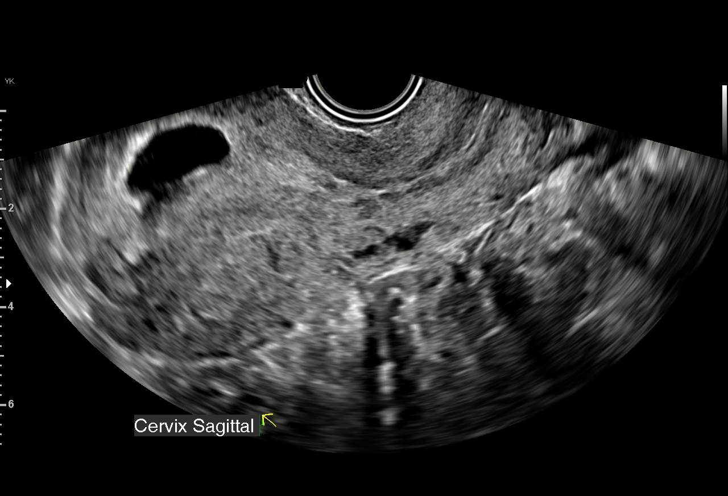
[im 44/48]
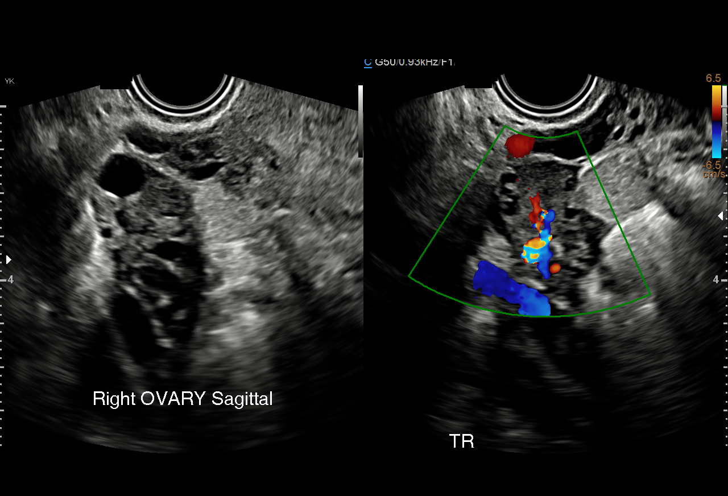
[im 48/48]
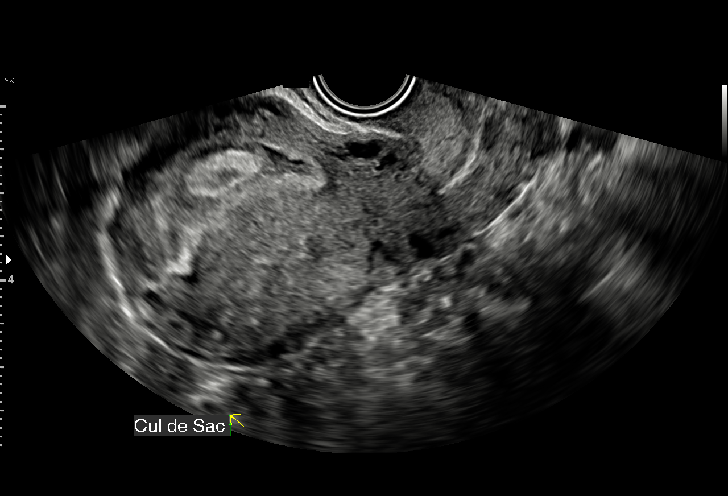

[15 of 28 positions shown; findings below may reference images not displayed]

FINDINGS: Intrauterine gestational sac: Present, single

Yolk sac:  Present

Embryo:  Present

Cardiac Activity: Present

Heart Rate: 114 bpm

CRL:  5.5 mm   6 w   1 d                  US EDC: 08/26/2021

Subchorionic hemorrhage:  Small subchronic hemorrhage

Maternal uterus/adnexae:

Uterus anteverted, otherwise unremarkable.

RIGHT ovary normal size and morphology 4.6 x 2.4 x 2.1 cm.

LEFT ovary normal size and morphology 3.3 x 2.3 x 1.8 cm.

No free pelvic fluid or adnexal masses.
IMPRESSION: Single live intrauterine gestation at 6 weeks 1 day EGA by
crown-rump length.

No acute abnormalities.

## 2023-01-24 ENCOUNTER — Encounter (HOSPITAL_COMMUNITY): Payer: Self-pay | Admitting: *Deleted

## 2023-01-24 ENCOUNTER — Ambulatory Visit (HOSPITAL_COMMUNITY)
Admission: EM | Admit: 2023-01-24 | Discharge: 2023-01-24 | Disposition: A | Discharge status: Home / Self Care | Payer: Medicaid Other | Attending: Emergency Medicine | Admitting: Emergency Medicine

## 2023-01-24 ENCOUNTER — Other Ambulatory Visit: Payer: Self-pay

## 2023-01-24 DIAGNOSIS — Z113 Encounter for screening for infections with a predominantly sexual mode of transmission: Secondary | ICD-10-CM | POA: Diagnosis not present

## 2023-01-24 DIAGNOSIS — N898 Other specified noninflammatory disorders of vagina: Secondary | ICD-10-CM | POA: Insufficient documentation

## 2023-01-24 LAB — POCT URINALYSIS DIPSTICK, ED / UC
Bilirubin Urine: NEGATIVE
Glucose, UA: NEGATIVE mg/dL
Hgb urine dipstick: NEGATIVE
Ketones, ur: NEGATIVE mg/dL
Nitrite: NEGATIVE
Protein, ur: NEGATIVE mg/dL
Specific Gravity, Urine: 1.02 (ref 1.005–1.030)
Urobilinogen, UA: 1 mg/dL (ref 0.0–1.0)
pH: 7 (ref 5.0–8.0)

## 2023-01-24 NOTE — ED Provider Notes (Signed)
[facility]    CSN: [id number] Arrival date & time: 01/24/23  1812     History   Chief Complaint Chief Complaint  Patient presents with   Abdominal Pain   Vaginal Discharge    HPI ALIVIANA EWY is a 27 y.o. female.  Presents with 1 week history of lower abdominal pain, cramping, vaginal discharge Denies known exposure to STD, reports unprotected intercourse  No urinary symptoms. Denies n/v/d No fever or chills  Reports hx of BV but this is a little different  Hx of PID Denies dyspareunia.  LMP 2/13 and was normal  Past Medical History:  Diagnosis Date   Acid reflux    Anemia    H/O repair of patent ductus arteriosus    PID (acute pelvic inflammatory disease) 05/2017    Patient Active Problem List   Diagnosis Date Noted   Normal labor and delivery 08/22/2021   SVD (spontaneous vaginal delivery) 08/22/2021   Normal postpartum course 08/22/2021   Chlamydia 09/20/2017    Past Surgical History:  Procedure Laterality Date   PATENT DUCTUS ARTERIOUS REPAIR     as an infant    OB History     Gravida  2   Para  2   Term  2   Preterm  0   AB  0   Living  2      SAB  0   IAB  0   Ectopic  0   Multiple  0   Live Births  2            Home Medications    Prior to Admission medications   Medication Sig Start Date End Date Taking? Authorizing Provider  acetaminophen (TYLENOL) 325 MG tablet Take 2 tablets (650 mg total) by mouth every 4 (four) hours as needed (for pain scale < 4). 08/23/21   Holshouser, Theone Murdoch, CNM  Prenatal Vit-Fe Phos-FA-Omega (VITAFOL GUMMIES) 3.33-0.333-34.8 MG CHEW Chew 1 tablet by mouth daily. 01/01/21   Darlina Rumpf, CNM    Family History Family History  Problem Relation Age of Onset   Arthritis Mother    Arthritis Maternal Grandmother    Breast cancer Paternal Grandmother    Down syndrome Sister    Diabetes Maternal Uncle     Social History Social History   Tobacco Use   Smoking  status: Never   Smokeless tobacco: Never  Vaping Use   Vaping Use: Never used  Substance Use Topics   Alcohol use: No   Drug use: No     Allergies   Flagyl [metronidazole], Other, and Penicillins   Review of Systems Review of Systems As per HPI  Physical Exam Triage Vital Signs ED Triage Vitals [01/24/23 1925]  Enc Vitals Group     BP      Pulse      Resp      Temp      Temp src      SpO2      Weight      Height      Head Circumference      Peak Flow      Pain Score 8     Pain Loc      Pain Edu?      Excl. in Magazine?    No data found.  Updated Vital Signs BP 135/80   Pulse 68   Temp 98 F (36.7 C)   Resp 18   LMP 01/18/2023   SpO2 99%  Physical Exam Vitals and nursing note reviewed.  Constitutional:      General: She is not in acute distress.    Appearance: Normal appearance.  HENT:     Mouth/Throat:     Pharynx: Oropharynx is clear.  Cardiovascular:     Rate and Rhythm: Normal rate and regular rhythm.     Pulses: Normal pulses.     Heart sounds: Normal heart sounds.  Pulmonary:     Effort: Pulmonary effort is normal.     Breath sounds: Normal breath sounds.  Abdominal:     Palpations: Abdomen is soft.     Tenderness: There is no abdominal tenderness. There is no right CVA tenderness, left CVA tenderness, guarding or rebound.  Genitourinary:    Comments: Deferred  Neurological:     Mental Status: She is alert and oriented to person, place, and time.      UC Treatments / Results  Labs (all labs ordered are listed, but only abnormal results are displayed) Labs Reviewed  POCT URINALYSIS DIPSTICK, ED / UC - Abnormal; Notable for the following components:      Result Value   Leukocytes,Ua SMALL (*)    All other components within normal limits  URINE CULTURE  CERVICOVAGINAL ANCILLARY ONLY    EKG   Radiology No results found.  Procedures Procedures (including critical care time)  Medications Ordered in UC Medications - No data to  display  Initial Impression / Assessment and Plan / UC Course  I have reviewed the triage vital signs and the nursing notes.  Pertinent labs & imaging results that were available during my care of the patient were reviewed by me and considered in my medical decision making (see chart for details).  UA with small leuks likely from vaginal discharge. Will culture given abd pain. Cytology swab pending. Discussed wait for results and treat if indicated. If positive BV, patient does well with oral clinda. No red flags today. Defer pelvic exam. Return precautions discussed. Patient agrees to plan  Final Clinical Impressions(s) / UC Diagnoses   Final diagnoses:  Vaginal discharge  Screen for STD (sexually transmitted disease)     Discharge Instructions      We will call you if anything on your swab returns positive. Please abstain from sexual intercourse until your results return. Notify all partners of results and need for testing/treatment.      ED Prescriptions   None    PDMP not reviewed this encounter.   Rikia Sukhu, Vernice Jefferson 01/24/23 2008

## 2023-01-24 NOTE — ED Triage Notes (Signed)
Pt reports ABD pain and cramping. Pt thinks she may have a bacterial vag infection. Pt has Vag discharge.

## 2023-01-24 NOTE — Discharge Instructions (Addendum)
We will call you if anything on your swab returns positive. Please abstain from sexual intercourse until your results return. Notify all partners of results and need for testing/treatment.

## 2023-01-25 LAB — URINE CULTURE

## 2023-01-25 LAB — CERVICOVAGINAL ANCILLARY ONLY
Bacterial Vaginitis (gardnerella): POSITIVE — AB
Candida Glabrata: NEGATIVE
Candida Vaginitis: POSITIVE — AB
Chlamydia: NEGATIVE
Comment: NEGATIVE
Comment: NEGATIVE
Comment: NEGATIVE
Comment: NEGATIVE
Comment: NEGATIVE
Comment: NORMAL
Neisseria Gonorrhea: NEGATIVE
Trichomonas: NEGATIVE

## 2023-01-26 ENCOUNTER — Telehealth (HOSPITAL_COMMUNITY): Payer: Self-pay | Admitting: Emergency Medicine

## 2023-01-26 MED ORDER — FLUCONAZOLE 150 MG PO TABS: 150.0000 mg | ORAL_TABLET | Freq: Once | ORAL | 0 refills | Status: AC

## 2023-01-26 MED ORDER — CLINDAMYCIN HCL 150 MG PO CAPS: 300.0000 mg | ORAL_CAPSULE | Freq: Two times a day (BID) | ORAL | 0 refills | Status: AC

## 2023-02-12 ENCOUNTER — Encounter (HOSPITAL_COMMUNITY): Payer: Self-pay

## 2023-02-12 ENCOUNTER — Ambulatory Visit (HOSPITAL_COMMUNITY)
Admission: EM | Admit: 2023-02-12 | Discharge: 2023-02-12 | Disposition: A | Discharge status: Home / Self Care | Payer: Medicaid Other | Attending: Emergency Medicine | Admitting: Emergency Medicine

## 2023-02-12 DIAGNOSIS — N926 Irregular menstruation, unspecified: Secondary | ICD-10-CM

## 2023-02-12 DIAGNOSIS — R110 Nausea: Secondary | ICD-10-CM

## 2023-02-12 LAB — POC URINE PREG, ED: Preg Test, Ur: NEGATIVE

## 2023-02-12 MED ORDER — ONDANSETRON 4 MG PO TBDP
4.0000 mg | ORAL_TABLET | Freq: Once | ORAL | Status: AC
  Administered 2023-02-12: 4 mg via ORAL

## 2023-02-12 MED ORDER — ONDANSETRON 4 MG PO TBDP: 4.0000 mg | ORAL_TABLET | Freq: Four times a day (QID) | ORAL | 0 refills | Status: AC | PRN

## 2023-02-12 MED ORDER — ONDANSETRON 4 MG PO TBDP
ORAL_TABLET | ORAL | Status: AC
  Filled 2023-02-12: qty 1

## 2023-02-12 MED ORDER — FAMOTIDINE 20 MG PO TABS: 20.0000 mg | ORAL_TABLET | Freq: Every day | ORAL | 2 refills | Status: DC

## 2023-02-12 NOTE — ED Provider Notes (Signed)
[place]    CSN: [id number] Arrival date & time: 02/12/23  1103     History   Chief Complaint Chief Complaint  Patient presents with   Nausea    HPI Lori Moses is a 27 y.o. female.  Here with nausea on and off for a few weeks Sometimes will have vomiting with eating No diarrhea or fevers Reports history of reflux, sometimes has sensation of this Has not taken any medications  Also reports heavy bleeding this month. Heavier than usual cycles. She is on day 5 and usually bleeds for 4  Has PCP visit next week, ob/gyn next month  Past Medical History:  Diagnosis Date   Acid reflux    Anemia    H/O repair of patent ductus arteriosus    PID (acute pelvic inflammatory disease) 05/2017    Patient Active Problem List   Diagnosis Date Noted   Normal labor and delivery 08/22/2021   SVD (spontaneous vaginal delivery) 08/22/2021   Normal postpartum course 08/22/2021   Chlamydia 09/20/2017    Past Surgical History:  Procedure Laterality Date   PATENT DUCTUS ARTERIOUS REPAIR     as an infant    OB History     Gravida  2   Para  2   Term  2   Preterm  0   AB  0   Living  2      SAB  0   IAB  0   Ectopic  0   Multiple  0   Live Births  2            Home Medications    Prior to Admission medications   Medication Sig Start Date End Date Taking? Authorizing Provider  acetaminophen (TYLENOL) 325 MG tablet Take 2 tablets (650 mg total) by mouth every 4 (four) hours as needed (for pain scale < 4). 08/23/21  Yes Holshouser, Theone Murdoch, CNM  famotidine (PEPCID) 20 MG tablet Take 1 tablet (20 mg total) by mouth daily. 02/12/23  Yes Darrian Grzelak, PA-C  ondansetron (ZOFRAN-ODT) 4 MG disintegrating tablet Take 1 tablet (4 mg total) by mouth every 6 (six) hours as needed for nausea or vomiting. 02/12/23  Yes Ulisses Vondrak, Wells Guiles, PA-C    Family History Family History  Problem Relation Age of Onset   Arthritis Mother    Arthritis Maternal  Grandmother    Breast cancer Paternal Grandmother    Down syndrome Sister    Diabetes Maternal Uncle     Social History Social History   Tobacco Use   Smoking status: Never   Smokeless tobacco: Never  Vaping Use   Vaping Use: Never used  Substance Use Topics   Alcohol use: No   Drug use: No     Allergies   Metronidazole, Penicillins, and Other   Review of Systems Review of Systems As per HPI  Physical Exam Triage Vital Signs ED Triage Vitals  Enc Vitals Group     BP 02/12/23 1146 101/73     Pulse Rate 02/12/23 1146 75     Resp 02/12/23 1146 16     Temp 02/12/23 1146 98.5 F (36.9 C)     Temp Source 02/12/23 1146 Oral     SpO2 02/12/23 1146 98 %     Weight --      Height --      Head Circumference --      Peak Flow --      Pain Score 02/12/23 1140 0  Pain Loc --      Pain Edu? --      Excl. in [place]? --    No data found.  Updated Vital Signs BP 101/73 (BP Location: Left Arm)   Pulse 75   Temp 98.5 F (36.9 C) (Oral)   Resp 16   LMP 02/07/2023 Comment: pt is currently on menstrual  SpO2 98%   Physical Exam Vitals and nursing note reviewed.  Constitutional:      General: She is not in acute distress.    Appearance: Normal appearance.  HENT:     Mouth/Throat:     Pharynx: Oropharynx is clear.  Eyes:     Conjunctiva/sclera: Conjunctivae normal.  Cardiovascular:     Rate and Rhythm: Normal rate and regular rhythm.     Pulses: Normal pulses.     Heart sounds: Normal heart sounds.  Pulmonary:     Effort: Pulmonary effort is normal.     Breath sounds: Normal breath sounds.  Abdominal:     General: There is no distension.     Palpations: Abdomen is soft.     Tenderness: There is no abdominal tenderness. There is no right CVA tenderness, left CVA tenderness or guarding.  Neurological:     Mental Status: She is alert and oriented to person, place, and time.     UC Treatments / Results  Labs (all labs ordered are listed, but only abnormal  results are displayed) Labs Reviewed  POC URINE PREG, ED    EKG  Radiology No results found.  Procedures Procedures (including critical care time)  Medications Ordered in UC Medications  ondansetron (ZOFRAN-ODT) disintegrating tablet 4 mg (4 mg Oral Given 02/12/23 1227)    Initial Impression / Assessment and Plan / UC Course  I have reviewed the triage vital signs and the nursing notes.  Pertinent labs & imaging results that were available during my care of the patient were reviewed by me and considered in my medical decision making (see chart for details).  Well appearing, stable vitals. No red flags Zofran ODT given for nausea in clinic.  Discussed she can use this every 6 hours at home.  Also recommend taking Pepcid once daily for possible reflux.  Discussed bland diet and foods to avoid upsetting the stomach.  UPT negative.  She is only bleeding 1 day longer than her normal cycles. Not urgently heavy just heavier than her normal.  I recommend to monitor.  She does have PCP visit next week she can follow-up with.  OB/GYN next month, I provided another clinic in case she wants to see someone sooner.  Final Clinical Impressions(s) / UC Diagnoses   Final diagnoses:  Nausea  Abnormal menstrual cycle     Discharge Instructions      You can use the zofran every 6 hours for nausea   Take the pepcid one daily to reduce reflux symptoms  Monitor menstrual cycle and discuss with primary care next week  I recommend follow up with women's clinic. You can call the number below to get sooner appointment if needed     ED Prescriptions     Medication Sig Dispense Auth. Provider   ondansetron (ZOFRAN-ODT) 4 MG disintegrating tablet Take 1 tablet (4 mg total) by mouth every 6 (six) hours as needed for nausea or vomiting. 20 tablet Elih Mooney, PA-C   famotidine (PEPCID) 20 MG tablet Take 1 tablet (20 mg total) by mouth daily. 30 tablet Kannon Granderson, Vernice Jefferson      PDMP  not  reviewed this encounter.   Les Pou, [place] 02/12/23 1258

## 2023-02-12 NOTE — ED Triage Notes (Signed)
Pt is here for a few things. Pt states her period has been heavy and lasting more than usual. Pt states he has been nausea x 3wks , pt is wanting to know does a orgasm make you vomit ? Pt aslo states she was unable to complete regiment for antibiotic due to vomiting

## 2023-02-12 NOTE — Discharge Instructions (Addendum)
You can use the zofran every 6 hours for nausea   Take the pepcid one daily to reduce reflux symptoms  Monitor menstrual cycle and discuss with primary care next week  I recommend follow up with women's clinic. You can call the number below to get sooner appointment if needed

## 2023-02-25 ENCOUNTER — Ambulatory Visit (HOSPITAL_COMMUNITY)
Admission: EM | Admit: 2023-02-25 | Discharge: 2023-02-25 | Disposition: A | Discharge status: Home / Self Care | Payer: Medicaid Other | Attending: Emergency Medicine | Admitting: Emergency Medicine

## 2023-02-25 ENCOUNTER — Encounter (HOSPITAL_COMMUNITY): Payer: Self-pay

## 2023-02-25 DIAGNOSIS — J029 Acute pharyngitis, unspecified: Secondary | ICD-10-CM | POA: Diagnosis not present

## 2023-02-25 DIAGNOSIS — Z1152 Encounter for screening for COVID-19: Secondary | ICD-10-CM | POA: Insufficient documentation

## 2023-02-25 LAB — POC INFLUENZA A AND B ANTIGEN (URGENT CARE ONLY)
INFLUENZA A ANTIGEN, POC: NEGATIVE
INFLUENZA B ANTIGEN, POC: NEGATIVE

## 2023-02-25 LAB — POCT RAPID STREP A, ED / UC: Streptococcus, Group A Screen (Direct): NEGATIVE

## 2023-02-25 MED ORDER — FLUTICASONE PROPIONATE 50 MCG/ACT NA SUSP: 1.0000 | Freq: Every day | NASAL | 2 refills | Status: AC

## 2023-02-25 MED ORDER — FEXOFENADINE HCL 180 MG PO TABS: 180.0000 mg | ORAL_TABLET | Freq: Every day | ORAL | 1 refills | Status: AC

## 2023-02-25 NOTE — Discharge Instructions (Addendum)
Your rapid influenza antigen test today was negative.  No further influenza testing is indicated.  You received a COVID-19 PCR test today.  The result of your COVID-19 test will be posted to your MyChart once it is complete, typically this takes 6 to 12 hours.      If your COVID-19 PCR test is positive, you will be contacted by phone.  Because you do not have a history of being immune compromised, you are currently vaccinated for COVID-19, you are under the age of 48, and/or you do not have a risk of severe disease due to COVID-19, antiviral treatment is not indicated.   If your COVID-19 PCR test is negative, please consider retesting in the next 2 to 3 days, particularly if you are not feeling any better.  You are welcome to return here to urgent care to have it done or you can take a home COVID-19 test.    If both your COVID-19 tests are negative, then you can safely assume that your illness is due to one of the many less serious illnesses circulating in our community right now.    Conservative care is recommended with rest, drinking plenty of clear fluids, eating only when hungry, taking supportive medications for your symptoms and avoiding being around other people.  Please remain at home until you are fever free for 24 hours without the use of antifever medications such as Tylenol and ibuprofen.   Your strep test today is negative.  Streptococcal throat culture will be performed per our protocol.  The result of your throat culture will be posted to your MyChart once it is complete, this typically takes 3 to 5 days.  If your streptococcal throat culture is positive, you will be contacted by phone and antibiotics will prescribed for you.   Based on my physical exam findings and the history you have provided  today, I do not recommend antibiotics at this time.  I do not believe the risks and side effects of antibiotics would outweigh any minimal benefit that they might provide.       Please read  below to learn more about the medications, dosages and frequencies that I recommend to help alleviate your symptoms and to get you feeling better soon:   Please read below to learn more about the medications, dosages and frequencies that I recommend to help alleviate your symptoms and to get you feeling better soon:   Allegra (fexofenadine): This is an excellent second-generation antihistamine that helps to reduce respiratory inflammatory response to environmental allergens.  This medication is not known to cause daytime sleepiness so it can be taken in the daytime.  If you find that it does make you sleepy, please feel free to take it at bedtime.   Flonase (fluticasone): This is a steroid nasal spray that used once daily, 1 spray in each nare.  This works best when used on a daily basis. This medication does not work well if it is only used when you think you need it.  After 3 to 5 days of use, you will notice significant reduction of the inflammation and mucus production that is currently being caused by exposure to allergens, whether seasonal or environmental.  The most common side effect of this medication is nosebleeds.  If you experience a nosebleed, please discontinue use for 1 week, then feel free to resume.  If you find that your insurance will not pay for this medication, please consider a different nasal steroids such as Nasonex (mometasone),  or Nasacort (triamcinolone).   If symptoms have not meaningfully improved in the next 5 to 7 days, please return for repeat evaluation or follow-up with your regular provider.  If symptoms have worsened in the next 3 to 5 days, please return for repeat evaluation or follow-up with your regular provider.    Thank you for visiting urgent care today.  We appreciate the opportunity to participate in your care.

## 2023-02-25 NOTE — ED Triage Notes (Signed)
Patient states her son has been sick and wants testing for flu, Covid, strep, and pneumonia.  Patient states she has a slight headache and a scratchy throat x 1 week.

## 2023-02-25 NOTE — ED Provider Notes (Signed)
[place]    CSN: [id number] Arrival date & time: 02/25/23  [id number]    HISTORY  No chief complaint on file.  HPI Lori Moses is a pleasant, 27 y.o. female who presents to urgent care today. Patient states her son has been sick and wants testing for flu, Covid, strep, and pneumonia.  States her son is at the hospital right now being tested.   Patient states she has a slight headache and a scratchy throat x 1 week.  Vital signs are normal on arrival.       The history is provided by the patient.   Past Medical History:  Diagnosis Date   Acid reflux    Anemia    H/O repair of patent ductus arteriosus    PID (acute pelvic inflammatory disease) 05/2017   Patient Active Problem List   Diagnosis Date Noted   Normal labor and delivery 08/22/2021   SVD (spontaneous vaginal delivery) 08/22/2021   Normal postpartum course 08/22/2021   Chlamydia 09/20/2017   Past Surgical History:  Procedure Laterality Date   PATENT DUCTUS ARTERIOUS REPAIR     as an infant   OB History     Gravida  2   Para  2   Term  2   Preterm  0   AB  0   Living  2      SAB  0   IAB  0   Ectopic  0   Multiple  0   Live Births  2          Home Medications    Prior to Admission medications   Medication Sig Start Date End Date Taking? Authorizing Provider  acetaminophen (TYLENOL) 325 MG tablet Take 2 tablets (650 mg total) by mouth every 4 (four) hours as needed (for pain scale < 4). 08/23/21   Holshouser, Theone Murdoch, CNM  famotidine (PEPCID) 20 MG tablet Take 1 tablet (20 mg total) by mouth daily. 02/12/23   Rising, Rebecca, PA-C  ondansetron (ZOFRAN-ODT) 4 MG disintegrating tablet Take 1 tablet (4 mg total) by mouth every 6 (six) hours as needed for nausea or vomiting. 02/12/23   Rising, Wells Guiles, PA-C    Family History Family History  Problem Relation Age of Onset   Arthritis Mother    Arthritis Maternal Grandmother    Breast cancer Paternal Grandmother    Down  syndrome Sister    Diabetes Maternal Uncle    Social History Social History   Tobacco Use   Smoking status: Never   Smokeless tobacco: Never  Vaping Use   Vaping Use: Never used  Substance Use Topics   Alcohol use: No   Drug use: No   Allergies   Metronidazole, Penicillins, and Other  Review of Systems Review of Systems Pertinent findings revealed after performing a 14 point review of systems has been noted in the history of present illness.  Physical Exam Vital Signs BP 110/72 (BP Location: Right Arm)   Pulse 72   Temp 98.6 F (37 C) (Oral)   Resp 14   LMP 02/07/2023 Comment: pt is currently on menstrual  SpO2 98%   No data found.  Physical Exam  Visual Acuity Right Eye Distance:   Left Eye Distance:   Bilateral Distance:    Right Eye Near:   Left Eye Near:    Bilateral Near:     UC Couse / Diagnostics / Procedures:     Radiology No results found.  Procedures Procedures (including critical  care time) EKG  Pending results:  Labs Reviewed  SARS CORONAVIRUS 2 (TAT 6-24 HRS)  CULTURE, GROUP A STREP [facility])  POCT RAPID STREP A, ED / UC  POC INFLUENZA A AND B ANTIGEN (URGENT CARE ONLY)    Medications Ordered in UC: Medications - No data to display  UC Diagnoses / Final Clinical Impressions(s)   I have reviewed the triage vital signs and the nursing notes.  Pertinent labs & imaging results that were available during my care of the patient were reviewed by me and considered in my medical decision making (see chart for details).    Final diagnoses:  Sore throat  Encounter for screening for COVID-19   *** Please see discharge instructions below for further details of plan of care as provided to patient. ED Prescriptions     Medication Sig Dispense Auth. Provider   fexofenadine (ALLEGRA) 180 MG tablet Take 1 tablet (180 mg total) by mouth daily. 90 tablet Lynden Oxford Scales, PA-C   fluticasone (FLONASE) 50 MCG/ACT nasal spray Place 1 spray  into both nostrils daily. Begin by using 2 sprays in each nare daily for 3 to 5 days, then decrease to 1 spray in each nare daily. 15.8 mL Lynden Oxford Scales, PA-C      PDMP not reviewed this encounter.  Disposition Upon Discharge:  Condition: stable for discharge home Home: take medications as prescribed; routine discharge instructions as discussed; follow up as advised.  Patient presented with an acute illness with associated systemic symptoms and significant discomfort requiring urgent management. In my opinion, this is a condition that a prudent lay person (someone who possesses an average knowledge of health and medicine) may potentially expect to result in complications if not addressed urgently such as respiratory distress, impairment of bodily function or dysfunction of bodily organs.   Routine symptom specific, illness specific and/or disease specific instructions were discussed with the patient and/or caregiver at length.   As such, the patient has been evaluated and assessed, work-up was performed and treatment was provided in alignment with urgent care protocols and evidence based medicine.  Patient/parent/caregiver has been advised that the patient may require follow up for further testing and treatment if the symptoms continue in spite of treatment, as clinically indicated and appropriate.  If the patient was tested for COVID-19, Influenza and/or RSV, then the patient/parent/guardian was advised to isolate at home pending the results of his/her diagnostic coronavirus test and potentially longer if they're positive. I have also advised pt that if his/her COVID-19 test returns positive, it's recommended to self-isolate for at least 10 days after symptoms first appeared AND until fever-free for 24 hours without fever reducer AND other symptoms have improved or resolved. Discussed self-isolation recommendations as well as instructions for household member/close contacts as per the [facility]  and [place] DHHS, and also gave patient the [place] packet with this information.  Patient/parent/caregiver has been advised to return to the [facility] or PCP in 3-5 days if no better; to PCP or the Emergency Department if new signs and symptoms develop, or if the current signs or symptoms continue to change or worsen for further workup, evaluation and treatment as clinically indicated and appropriate  The patient will follow up with their current PCP if and as advised. If the patient does not currently have a PCP we will assist them in obtaining one.   The patient may need specialty follow up if the symptoms continue, in spite of conservative treatment and management, for further workup, evaluation,  consultation and treatment as clinically indicated and appropriate.  Patient/parent/caregiver verbalized understanding and agreement of plan as discussed.  All questions were addressed during visit.  Please see discharge instructions below for further details of plan.  Discharge Instructions:   Discharge Instructions      Your rapid influenza antigen test today was negative.  No further influenza testing is indicated.  You received a COVID-19 PCR test today.  The result of your COVID-19 test will be posted to your MyChart once it is complete, typically this takes 6 to 12 hours.      If your COVID-19 PCR test is positive, you will be contacted by phone.  Because you do not have a history of being immune compromised, you are currently vaccinated for COVID-19, you are under the age of 53, and/or you do not have a risk of severe disease due to COVID-19, antiviral treatment is not indicated.   If your COVID-19 PCR test is negative, please consider retesting in the next 2 to 3 days, particularly if you are not feeling any better.  You are welcome to return here to urgent care to have it done or you can take a home COVID-19 test.    If both your COVID-19 tests are negative, then you can safely assume that your  illness is due to one of the many less serious illnesses circulating in our community right now.    Conservative care is recommended with rest, drinking plenty of clear fluids, eating only when hungry, taking supportive medications for your symptoms and avoiding being around other people.  Please remain at home until you are fever free for 24 hours without the use of antifever medications such as Tylenol and ibuprofen.   Your strep test today is negative.  Streptococcal throat culture will be performed per our protocol.  The result of your throat culture will be posted to your MyChart once it is complete, this typically takes 3 to 5 days.  If your streptococcal throat culture is positive, you will be contacted by phone and antibiotics will prescribed for you.   Based on my physical exam findings and the history you have provided  today, I do not recommend antibiotics at this time.  I do not believe the risks and side effects of antibiotics would outweigh any minimal benefit that they might provide.       Please read below to learn more about the medications, dosages and frequencies that I recommend to help alleviate your symptoms and to get you feeling better soon:   Please read below to learn more about the medications, dosages and frequencies that I recommend to help alleviate your symptoms and to get you feeling better soon:   Allegra (fexofenadine): This is an excellent second-generation antihistamine that helps to reduce respiratory inflammatory response to environmental allergens.  This medication is not known to cause daytime sleepiness so it can be taken in the daytime.  If you find that it does make you sleepy, please feel free to take it at bedtime.   Flonase (fluticasone): This is a steroid nasal spray that used once daily, 1 spray in each nare.  This works best when used on a daily basis. This medication does not work well if it is only used when you think you need it.  After 3 to 5 days of  use, you will notice significant reduction of the inflammation and mucus production that is currently being caused by exposure to allergens, whether seasonal or environmental.  The  most common side effect of this medication is nosebleeds.  If you experience a nosebleed, please discontinue use for 1 week, then feel free to resume.  If you find that your insurance will not pay for this medication, please consider a different nasal steroids such as Nasonex (mometasone), or Nasacort (triamcinolone).   If symptoms have not meaningfully improved in the next 5 to 7 days, please return for repeat evaluation or follow-up with your regular provider.  If symptoms have worsened in the next 3 to 5 days, please return for repeat evaluation or follow-up with your regular provider.    Thank you for visiting urgent care today.  We appreciate the opportunity to participate in your care.       This office note has been dictated using Museum/gallery curator.  Unfortunately, this method of dictation can sometimes lead to typographical or grammatical errors.  I apologize for your inconvenience in advance if this occurs.  Please do not hesitate to reach out to me if clarification is needed.

## 2023-02-26 LAB — SARS CORONAVIRUS 2 (TAT 6-24 HRS): SARS Coronavirus 2: NEGATIVE

## 2023-02-28 LAB — CULTURE, GROUP A STREP (THRC)

## 2023-03-25 DIAGNOSIS — N898 Other specified noninflammatory disorders of vagina: Secondary | ICD-10-CM | POA: Diagnosis not present

## 2023-03-25 DIAGNOSIS — Z113 Encounter for screening for infections with a predominantly sexual mode of transmission: Secondary | ICD-10-CM | POA: Diagnosis not present

## 2023-03-31 DIAGNOSIS — A609 Anogenital herpesviral infection, unspecified: Secondary | ICD-10-CM | POA: Diagnosis not present

## 2024-01-04 ENCOUNTER — Encounter: Payer: Self-pay | Admitting: Neurology

## 2024-01-30 NOTE — Progress Notes (Deleted)
 Assessment/Plan:   1.  Essential Tremor.  -This is evidenced by the symmetrical nature and longstanding hx of gradually getting worse.  We discussed nature and pathophysiology.  We discussed that this can continue to gradually get worse with time.  We discussed that some medications can worsen this, as can caffeine use.  We discussed medication therapy as well as surgical therapy.  Ultimately, the patient decided to ***.    2.  Memory change, by history  -No evidence of a neurodegenerative process today.  -Discussed with the patient that, unfortunately, because of access issues and bandwidth in our clinic, we do not formally do neurocognitive testing in patients of this age group because of the very low likely possibility that it is neurodegenerative in nature.  Her primary care will need to work this up if this is felt necessary.  The large majority of memory issues in this age group are either medication related or associated with anxiety/depression. Subjective:   Lori Moses was seen in consultation in the movement disorder clinic at the request of Nathaneil Canary, PA-C.  The evaluation is for tremor.  Medical records are reviewed.  Medical records note tremor has been going on intermittently for 1 year.  She initially noticed it when caring for her children, and when they were both upset and crying, her hand started to shake.  She has since noticed it when she is at work, even when there is not a lot of stress.  Tremor started approximately *** ago and involves the ***.  Tremor is most noticeable when ***.   There is *** family hx of tremor.    Affected by caffeine:  {yes no:314532} Affected by alcohol:  {yes no:314532} Affected by stress:  {yes no:314532} Affected by fatigue:  {yes no:314532} Spills soup if on spoon:  {yes no:314532} Spills glass of liquid if full:  {yes no:314532} Affects ADL's (tying shoes, brushing teeth, etc):  {yes no:314532}  Current/Previously tried tremor  medications: ***  Current medications that may exacerbate tremor:  ***  Separately, she does mention concerns about memory change.  Outside reports reviewed: {Outside review:15817}.  Allergies  Allergen Reactions   Metronidazole Hives    Oral and topical   Penicillins Hives    Has patient had a PCN reaction causing immediate rash, facial/tongue/throat swelling, SOB or lightheadedness with hypotension: Yes  Has patient had a PCN reaction causing severe rash involving mucus membranes or skin necrosis: No  Has patient had a PCN reaction that required hospitalization No  Has patient had a PCN reaction occurring within the last 10 years: No  If all of the above answers are "NO", then may proceed with Cephalosporin use.   Other Hives    potatoes    No outpatient medications have been marked as taking for the 02/02/24 encounter (Appointment) with Carly Applegate, Octaviano Batty, DO.      Objective:   VITALS:  There were no vitals filed for this visit. Gen:  Appears stated age and in NAD. HEENT:  Normocephalic, atraumatic. The mucous membranes are moist. The superficial temporal arteries are without ropiness or tenderness. Cardiovascular: Regular rate and rhythm. Lungs: Clear to auscultation bilaterally. Neck: There are no carotid bruits noted bilaterally.  NEUROLOGICAL:  Orientation:  The patient is alert and oriented x 3.   Cranial nerves: There is good facial symmetry. Extraocular muscles are intact and visual fields are full to confrontational testing. Speech is fluent and clear. Soft palate rises symmetrically and there is no tongue  deviation. Hearing is intact to conversational tone. Tone: Tone is good throughout. Sensation: Sensation is intact to light touch touch throughout (facial, trunk, extremities). Vibration is intact at the bilateral big toe. There is no extinction with double simultaneous stimulation. There is no sensory dermatomal level identified. Coordination:  The patient has  no dysdiadichokinesia or dysmetria. Motor: Strength is 5/5 in the bilateral upper and lower extremities.  Shoulder shrug is equal bilaterally.  There is no pronator drift.  There are no fasciculations noted. DTR's: Deep tendon reflexes are 2/4 at the bilateral biceps, triceps, brachioradialis, patella and achilles.  Plantar responses are downgoing bilaterally. Gait and Station: The patient is able to ambulate without difficulty. The patient is able to heel toe walk without any difficulty. The patient is able to ambulate in a tandem fashion. The patient is able to stand in the Romberg position.   MOVEMENT EXAM: Tremor:  There is *** tremor in the UE, noted most significantly with action.  The patient is *** able to draw Archimedes spirals without significant difficulty.  There is *** tremor at rest.  The patient is *** able to pour water from one glass to another without spilling it.  I have reviewed and interpreted the following labs independently   Chemistry      Component Value Date/Time   NA 136 01/01/2021 1216   NA 145 (H) 07/06/2018 1045   K 4.3 01/01/2021 1216   CL 105 01/01/2021 1216   CO2 24 01/01/2021 1216   BUN 6 01/01/2021 1216   BUN 4 (L) 07/06/2018 1045   CREATININE 0.62 01/01/2021 1216      Component Value Date/Time   CALCIUM 9.1 01/01/2021 1216   ALKPHOS 52 01/01/2021 1216   AST 14 (L) 01/01/2021 1216   ALT 10 01/01/2021 1216   BILITOT 0.7 01/01/2021 1216   BILITOT 0.6 07/06/2018 1045      Lab Results  Component Value Date   WBC 9.6 08/22/2021   HGB 11.0 (L) 08/22/2021   HCT 33.4 (L) 08/22/2021   MCV 80.3 08/22/2021   PLT 125 (L) 08/22/2021   Lab Results  Component Value Date   TSH 1.310 07/06/2018      Total time spent on today's visit was ***60 minutes, including both face-to-face time and nonface-to-face time.  Time included that spent on review of records (prior notes available to me/labs/imaging if pertinent), discussing treatment and goals, answering  patient's questions and coordinating care.  CC:  Pcp, No

## 2024-02-02 ENCOUNTER — Ambulatory Visit: Payer: Medicaid Other | Admitting: Neurology
